# Patient Record
Sex: Male | Born: 1955
Health system: Southern US, Community
[De-identification: ages and names within clinical notes are randomized; demographics above are authoritative.]

## PROBLEM LIST (undated history)

## (undated) DIAGNOSIS — H269 Unspecified cataract: Secondary | ICD-10-CM

## (undated) DIAGNOSIS — I341 Nonrheumatic mitral (valve) prolapse: Secondary | ICD-10-CM

## (undated) DIAGNOSIS — H8110 Benign paroxysmal vertigo, unspecified ear: Secondary | ICD-10-CM

## (undated) DIAGNOSIS — F419 Anxiety disorder, unspecified: Secondary | ICD-10-CM

## (undated) DIAGNOSIS — Z8619 Personal history of other infectious and parasitic diseases: Secondary | ICD-10-CM

## (undated) DIAGNOSIS — F329 Major depressive disorder, single episode, unspecified: Secondary | ICD-10-CM

## (undated) DIAGNOSIS — F32A Depression, unspecified: Secondary | ICD-10-CM

## (undated) HISTORY — DX: Depression, unspecified: F32.A

## (undated) HISTORY — DX: Anxiety disorder, unspecified: F41.9

## (undated) HISTORY — DX: Unspecified cataract: H26.9

## (undated) HISTORY — PX: TONSILECTOMY, ADENOIDECTOMY, BILATERAL MYRINGOTOMY AND TUBES: SHX2538

## (undated) HISTORY — DX: Major depressive disorder, single episode, unspecified: F32.9

## (undated) HISTORY — DX: Nonrheumatic mitral (valve) prolapse: I34.1

## (undated) HISTORY — DX: Personal history of other infectious and parasitic diseases: Z86.19

## (undated) HISTORY — DX: Benign paroxysmal vertigo, unspecified ear: H81.10

---

## 2005-05-25 ENCOUNTER — Ambulatory Visit: Payer: Self-pay | Admitting: Internal Medicine

## 2013-01-19 ENCOUNTER — Ambulatory Visit: Payer: Self-pay | Admitting: Family Medicine

## 2013-02-03 ENCOUNTER — Ambulatory Visit (INDEPENDENT_AMBULATORY_CARE_PROVIDER_SITE_OTHER): Payer: No Typology Code available for payment source | Admitting: Family Medicine

## 2013-02-03 VITALS — BP 100/64 | HR 56 | Temp 98.0°F | Resp 12 | Ht 73.25 in | Wt 166.2 lb

## 2013-02-03 DIAGNOSIS — R002 Palpitations: Secondary | ICD-10-CM

## 2013-02-03 DIAGNOSIS — F411 Generalized anxiety disorder: Secondary | ICD-10-CM

## 2013-02-03 DIAGNOSIS — F429 Obsessive-compulsive disorder, unspecified: Secondary | ICD-10-CM

## 2013-02-03 LAB — POCT CBC
Lymph, poc: 1.3 (ref 0.6–3.4)
MCH, POC: 30.6 pg (ref 27–31.2)
MCHC: 32.3 g/dL (ref 31.8–35.4)
MCV: 94.7 fL (ref 80–97)
MID (cbc): 0.5 (ref 0–0.9)
POC LYMPH PERCENT: 17.6 %L (ref 10–50)
Platelet Count, POC: 268 10*3/uL (ref 142–424)
RBC: 4.93 M/uL (ref 4.69–6.13)
WBC: 7.4 10*3/uL (ref 4.6–10.2)

## 2013-02-03 LAB — COMPREHENSIVE METABOLIC PANEL
CO2: 31 mEq/L (ref 19–32)
Creat: 1.22 mg/dL (ref 0.50–1.35)
Glucose, Bld: 87 mg/dL (ref 70–99)
Total Bilirubin: 0.7 mg/dL (ref 0.3–1.2)

## 2013-02-03 LAB — POCT URINALYSIS DIPSTICK
Ketones, UA: NEGATIVE
Leukocytes, UA: NEGATIVE
Protein, UA: NEGATIVE
Spec Grav, UA: 1.02
pH, UA: 7.5

## 2013-02-03 LAB — POCT UA - MICROSCOPIC ONLY
Bacteria, U Microscopic: NEGATIVE
Casts, Ur, LPF, POC: NEGATIVE
Crystals, Ur, HPF, POC: NEGATIVE
Yeast, UA: NEGATIVE

## 2013-02-03 LAB — TSH: TSH: 2.731 u[IU]/mL (ref 0.350–4.500)

## 2013-02-03 MED ORDER — FLUOXETINE HCL 40 MG PO CAPS: 40.0000 mg | ORAL_CAPSULE | Freq: Every day | ORAL | Status: DC

## 2013-02-03 NOTE — Assessment & Plan Note (Signed)
Controlled; no change in medication; refill provided. 

## 2013-02-03 NOTE — Assessment & Plan Note (Signed)
New. Onset after using Creatine supplement but has persisted for the past week. Obtain labs including CMET, TSH, CBC, u/a.  Refer to cardiology.  EKG normal today other than short PR interval.

## 2013-02-03 NOTE — Assessment & Plan Note (Signed)
Controlled; no change in therapy; refill provided.

## 2013-02-03 NOTE — Progress Notes (Signed)
[address]   [place], [place]  [address]   [phone]  Subjective:    Patient ID: Joseph Saunders, male    DOB: December 27, 1955, 57 y.o.   MRN: [id number]  HPI This 57 y.o. male presents for evaluation of the following:  1.  Palpitations: in evenings and at bed, notices fluttering in chest.  Has been taking Creatinne after exercise.  Coffee and tea daily; no excess; one glass of beer or wine.Just restarted exercising in past few weeks; just restarted exercises.  Sporadic walking before starting running program.  Feels well with running except stamina still decreased.  Denies chest pain, SOB, leg swelling.  2.  Anxiety and OCD: onset during adolescents.  History of excessive worry and hypochondriasis.  Well controlled with Fluoxetine 40mg  daily.   Work and personal life going really well.  History of tics in teenage years; no persistence. Doing really well emotionally.  Wife feels that patient doing really well emotionally.  Review of Systems  Constitutional: Negative for fever, chills, diaphoresis, activity change, appetite change and fatigue.  Respiratory: Negative for shortness of breath, wheezing and stridor.   Cardiovascular: Positive for palpitations. Negative for chest pain and leg swelling.  Neurological: Negative for dizziness, seizures, syncope, facial asymmetry, weakness, light-headedness, numbness and headaches.  Psychiatric/Behavioral: Negative for suicidal ideas, sleep disturbance, self-injury, dysphoric mood and decreased concentration. The patient is nervous/anxious.     Past Medical History  Diagnosis Date  . Depression   . Mitral valve prolapse   . Cataract   . Anxiety     OCD; history of tics in adolescents.    Past Surgical History  Procedure Laterality Date  . Tonsilectomy, adenoidectomy, bilateral myringotomy and tubes      Prior to Admission medications   Medication Sig Start Date End Date Taking? Authorizing Provider  fish oil-omega-3 fatty acids 1000  MG capsule Take 2 g by mouth daily.   Yes Historical Provider, MD  FLUoxetine (PROZAC) 40 MG capsule Take 40 mg by mouth daily.   Yes Historical Provider, MD  Multiple Vitamins-Minerals (MULTIVITAMIN WITH MINERALS) tablet Take 1 tablet by mouth daily.   Yes Historical Provider, MD    Allergies  Allergen Reactions  . Sulfa Antibiotics Nausea And Vomiting    History   Social History  . Marital Status: Single    Spouse Name: N/A    Number of Children: 4  . Years of Education: N/A   Occupational History  .      insurance agent and business   Social History Main Topics  . Smoking status: Never Smoker   . Smokeless tobacco: Not on file  . Alcohol Use: 3.0 oz/week    2 Glasses of wine, 3 Cans of beer per week  . Drug Use: No  . Sexually Active: Yes     Comment: married   Other Topics Concern  . Not on file   Social History Narrative  . No narrative on file    Family History  Problem Relation Age of Onset  . Heart disease Father 61    CHF; smoking/tobacco  . Diverticulitis Brother   . Mental illness Brother   . Cancer Maternal Grandfather   . Heart disease Paternal Grandfather   . Osteoporosis Mother   . Mental illness Brother        Objective:   Physical Exam  Nursing note and vitals reviewed. Constitutional: He is oriented to person, place, and time. He appears well-developed and well-nourished. No distress.  Cardiovascular: Normal rate, regular rhythm, normal heart sounds and intact distal pulses.  Exam reveals no gallop and no friction rub.   No murmur heard. Pulmonary/Chest: Effort normal and breath sounds normal.  Abdominal: Soft. Bowel sounds are normal. He exhibits no distension and no mass. There is no tenderness. There is no rebound and no guarding.  Neurological: He is alert and oriented to person, place, and time. No cranial nerve deficit. He exhibits normal muscle tone. Coordination normal.  Skin: Skin is warm and dry. He is not diaphoretic.    Psychiatric: He has a normal mood and affect. His behavior is normal. Judgment and thought content normal.    EKG:  NSR; short PR interval.    Results for orders placed in visit on 02/03/13  POCT CBC      Result Value Range   WBC 7.4  4.6 - 10.2 K/uL   Lymph, poc 1.3  0.6 - 3.4   POC LYMPH PERCENT 17.6  10 - 50 %L   MID (cbc) 0.5  0 - 0.9   POC MID % 6.1  0 - 12 %M   POC Granulocyte 5.6  2 - 6.9   Granulocyte percent 76.3  37 - 80 %G   RBC 4.93  4.69 - 6.13 M/uL   Hemoglobin 15.1  14.1 - 18.1 g/dL   HCT, POC 16.1  09.6 - 53.7 %   MCV 94.7  80 - 97 fL   MCH, POC 30.6  27 - 31.2 pg   MCHC 32.3  31.8 - 35.4 g/dL   RDW, POC 04.5     Platelet Count, POC 268  142 - 424 K/uL   MPV 8.5  0 - 99.8 fL  POCT UA - MICROSCOPIC ONLY      Result Value Range   WBC, Ur, HPF, POC neg     RBC, urine, microscopic neg     Bacteria, U Microscopic neg     Mucus, UA neg     Epithelial cells, urine per micros neg     Crystals, Ur, HPF, POC neg     Casts, Ur, LPF, POC neg     Yeast, UA neg     Amorphous, UA positive    POCT URINALYSIS DIPSTICK      Result Value Range   Color, UA yellow     Clarity, UA clear     Glucose, UA neg     Bilirubin, UA neg     Ketones, UA neg     Spec Grav, UA 1.020     Blood, UA neg     pH, UA 7.5     Protein, UA neg     Urobilinogen, UA 0.2     Nitrite, UA neg     Leukocytes, UA Negative      Assessment & Plan:  Palpitations - Plan: EKG 12-Lead, POCT CBC, POCT UA - Microscopic Only, POCT urinalysis dipstick, Comprehensive metabolic panel, TSH  Generalized anxiety disorder  OCD (obsessive compulsive disorder)

## 2013-02-09 NOTE — Progress Notes (Signed)
CPE appt made for 06/05/13.

## 2013-05-24 ENCOUNTER — Ambulatory Visit (INDEPENDENT_AMBULATORY_CARE_PROVIDER_SITE_OTHER): Payer: No Typology Code available for payment source | Admitting: Family Medicine

## 2013-05-24 ENCOUNTER — Encounter: Payer: Self-pay | Admitting: Family Medicine

## 2013-05-24 VITALS — HR 64 | Temp 97.6°F | Resp 16 | Ht 73.0 in | Wt 169.4 lb

## 2013-05-24 DIAGNOSIS — Z1211 Encounter for screening for malignant neoplasm of colon: Secondary | ICD-10-CM

## 2013-05-24 DIAGNOSIS — Z23 Encounter for immunization: Secondary | ICD-10-CM

## 2013-05-24 DIAGNOSIS — Z0000 Encounter for general adult medical examination without abnormal findings: Secondary | ICD-10-CM

## 2013-05-24 DIAGNOSIS — Z125 Encounter for screening for malignant neoplasm of prostate: Secondary | ICD-10-CM

## 2013-05-24 LAB — LIPID PANEL
Cholesterol: 159 mg/dL (ref 0–200)
HDL: 46 mg/dL (ref >39)
Total CHOL/HDL Ratio: 3.5 Ratio

## 2013-05-24 LAB — COMPREHENSIVE METABOLIC PANEL
ALT: 19 U/L (ref 0–53)
AST: 22 U/L (ref 0–37)
Alkaline Phosphatase: 81 U/L (ref 39–117)
BUN: 13 mg/dL (ref 6–23)
CO2: 28 mEq/L (ref 19–32)
Creat: 1.05 mg/dL (ref 0.50–1.35)
Sodium: 137 mEq/L (ref 135–145)

## 2013-05-24 LAB — HEMOGLOBIN A1C
Hgb A1c MFr Bld: 5.2 % (ref <5.7)
Mean Plasma Glucose: 103 mg/dL (ref <117)

## 2013-05-24 LAB — PSA: PSA: 1.42 ng/mL (ref <=4.00)

## 2013-05-24 NOTE — Patient Instructions (Signed)
1.  Please start Aspirin 81mg  one tablet daily for heart attack and stroke prevention. 2.  We will contact you in the upcoming week with appointment with Dr. Matthias Hughs. 3.  Recommend evaluation by dermatologist within upcoming year.

## 2013-05-24 NOTE — Progress Notes (Signed)
[address]   [place], [place]  [address]   [phone]  Subjective:    Patient ID: Joseph Saunders, male    DOB: May 29, 1956, 57 y.o.   MRN: [id number] This chart was scribed for Ethelda Chick, MD by Valera Castle, ED Scribe. This patient was seen in room 21 and the patient's care was started at 10:17 AM.  HPI Joseph Saunders is a 57 y.o. male who presents to the [facility] for an annual exam. He states he cannot remember his last exam, but states he plans to f/u regularly for his exams from now on. He was seen here last 12/2011. He reports having his Prozac Rx filled and that he was having increased heart beat then as well, but that they have subsided.  Due to improvement with cessation of creatine supplement, patient cancelled appointment after last visit with cardiology.  He denies having a colonoscopy. He is compliant with setting up an appointment for one. He reports having his flu vaccination today. He reports that his last tetanus was in 2006. He reports seeing Dr. Elisabeth Most.ophthalmologist.   He reports h/o degenerative cataracts. He states he will see Dr. Alden Hipp due to insurance reasons. He reports it has been a while since his last dentist visit. He reports the only medication he has been taking is his Prozac. He reports taking multi-vitamins regularly, and Aspirin erratically, 1-2 times a week.   He reports being married for 30 years and having a healthy relationship with his wife. He reports they have 4 children, 3 biological and 1 adopted. He denies having any grandchildren. He reports his oldest son just got married. He reports being in the [employer] for his profession. He denies a h/o smoking, IV drug use. He reports occasional EtOH use. He states he was exercising 3 days a week with weights and walking, but reports just walking now, due to some mild elbow pain. He reports mild left thumb pain. He reports he wears his seatbelt regularly. He denies having any guns at  home. He likes to fish.   He reports his mother is doing well, and not taking any medication. He reports his father passed away in his sleep at 42 of CHF and black lung. He denies his father having a h/o MI. He reports having 2 brothers and 3 sisters. His youngest brother is a hypochondriac. His oldest brother has diverticulitis and recent colon surgery. He denies either brother having heart problems or cancer. He reports being allergic to Sulfides. He reports his sisters are healthy.   He reports a h/o BPV, but denies any recent episodes. He reports being worried about some skin spots on his head. He reports having a screening done on a spot on his left forehead, that came back negative. He reports a h/o walking pneumonia at 57 years old, and states that ever since then when he coughs he hears an unusual sound. He states that the sound has not worsened any. He reports going to a chiropractor for his neck pain, with success. He reports a h/o inguinal hernias. He states that he only has to urinate once an evening. He reports that his urine stream has decreased a little. He reports him and his wife usually go to bed late, and wake up early. He reports a h/o prostate exam in 2000 by Dr. Lovell Sheehan.   He denies hearing problems, constant ear ringing, neck pain, chest pain, back pain, SOB, extremity numbness and tingling, leg swelling,  abnormal BMs, diarrhea, constipation, melena, hematochezia, frequent heartburn, nausea, emesis, frequency, erection dysfunction, sexual problems, scrotal swelling and pain, penile swelling and pain, and any other associated symptoms.   Patient Active Problem List   Diagnosis Date Noted   OCD (obsessive compulsive disorder) 02/03/2013   Generalized anxiety disorder 02/03/2013   Palpitations 02/03/2013   Past Medical History  Diagnosis Date   Depression    Mitral valve prolapse    Cataract    Anxiety     OCD; history of tics in adolescents.   Benign positional  vertigo    Past Surgical History  Procedure Laterality Date   Tonsilectomy, adenoidectomy, bilateral myringotomy and tubes     Allergies  Allergen Reactions   Sulfa Antibiotics Nausea And Vomiting   Prior to Admission medications   Medication Sig Start Date End Date Taking? Authorizing Provider  fish oil-omega-3 fatty acids 1000 MG capsule Take 2 g by mouth daily.   Yes Historical Provider, MD  FLUoxetine (PROZAC) 40 MG capsule Take 1 capsule (40 mg total) by mouth daily. 02/03/13  Yes Ethelda Chick, MD  Multiple Vitamins-Minerals (MULTIVITAMIN WITH MINERALS) tablet Take 1 tablet by mouth daily.   Yes Historical Provider, MD    Review of Systems  Constitutional: Negative for activity change, fatigue and unexpected weight change.  HENT: Positive for dental problem. Negative for ear pain, hearing loss, mouth sores, tinnitus, trouble swallowing and voice change.   Eyes: Positive for photophobia and visual disturbance. Negative for pain.  Respiratory: Negative for cough, chest tightness and shortness of breath.   Cardiovascular: Negative for chest pain and leg swelling.  Gastrointestinal: Negative for nausea, vomiting, abdominal pain, diarrhea, constipation, blood in stool and rectal pain.  Endocrine: Negative for heat intolerance, polydipsia, polyphagia and polyuria.  Genitourinary: Positive for decreased urine volume. Negative for dysuria, urgency, frequency, hematuria, flank pain, penile swelling, scrotal swelling, enuresis, difficulty urinating, genital sores, penile pain and testicular pain.  Musculoskeletal: Positive for arthralgias (elbow, left thumb). Negative for back pain, joint swelling, neck pain and neck stiffness.  Skin: Positive for color change. Negative for rash.  Neurological: Negative for dizziness, tremors, syncope, facial asymmetry, speech difficulty, weakness, light-headedness, numbness and headaches.  Psychiatric/Behavioral: Negative for sleep disturbance, dysphoric  mood and decreased concentration. The patient is not nervous/anxious.   All other systems reviewed and are negative.      Objective:   Physical Exam  Nursing note and vitals reviewed. Constitutional: He is oriented to person, place, and time. He appears well-developed and well-nourished. No distress.  HENT:  Head: Normocephalic and atraumatic.  Right Ear: External ear normal.  Left Ear: External ear normal.  Nose: Nose normal.  Mouth/Throat: Oropharynx is clear and moist.  Eyes: Conjunctivae and EOM are normal. Pupils are equal, round, and reactive to light.  Neck: Normal range of motion. Neck supple. No JVD present. No tracheal deviation present. No thyromegaly present.  Cardiovascular: Normal rate, regular rhythm, normal heart sounds and intact distal pulses.  Exam reveals no gallop and no friction rub.   No murmur heard. Pulmonary/Chest: Effort normal and breath sounds normal. No respiratory distress. He has no wheezes. He has no rales.  Abdominal: Soft. Bowel sounds are normal. He exhibits no distension and no mass. There is no tenderness. There is no rebound and no guarding. Hernia confirmed negative in the right inguinal area and confirmed negative in the left inguinal area.  Genitourinary: Rectum normal, testes normal and penis normal. Prostate is enlarged. No penile tenderness.  Musculoskeletal: Normal range of motion.  Lymphadenopathy:    He has no cervical adenopathy.  Neurological: He is alert and oriented to person, place, and time. He has normal reflexes. No cranial nerve deficit. He exhibits normal muscle tone. Coordination normal.  Skin: Skin is warm and dry.  Psychiatric: He has a normal mood and affect. His behavior is normal. Judgment and thought content normal.    Triage Vitals: Pulse 64   Temp(Src) 97.6 F (36.4 C) (Oral)   Resp 16   Ht 6\' 1"  (1.854 m)   Wt 169 lb 6.4 oz (76.839 kg)   BMI 22.35 kg/m2   SpO2 98%  INFLUENZA VACCINE AND TDAP ADMINISTERED.        Assessment & Plan:  Routine general medical examination at a health care facility - Plan: Hemoglobin A1c, Lipid panel, PSA, Comprehensive metabolic panel, Vitamin B12, Vit D  25 hydroxy (rtn osteoporosis monitoring)  Need for prophylactic vaccination and inoculation against influenza - Plan: Flu Vaccine QUAD 36+ mos IM  Colon cancer screening - Plan: Ambulatory referral to Gastroenterology  Need for prophylactic vaccination with combined diphtheria-tetanus-pertussis (DTP) vaccine - Plan: Tdap vaccine greater than or equal to 7yo IM  Prostate cancer screening - Plan: PSA  1. CPE:  Anticipatory guidance provided.  Recommend starting ASA 81mg  daily; s/p TDAP and influenza vaccines in office. Refer for colonoscopy.  Obtain labs. 2.  S/p TDAP 3. S/p influenza vaccine. 4.  Colon cancer screening: refer to GI for colonoscopy. 5.  Prostate cancer screening: s/p DRE; obtain PSA. 6.  OCD/anxiety/tics: controlled with current dose of Prozac.     I personally performed the services described in this documentation, which was scribed in my presence.  The recorded information has been reviewed and is accurate.

## 2013-05-24 NOTE — Progress Notes (Signed)
  Subjective:    Patient ID: Joseph Saunders, male    DOB: 1956-01-14, 57 y.o.   MRN: [id number]  HPI    Review of Systems  Constitutional: Negative.   HENT: Positive for dental problem.   Respiratory: Negative.   Cardiovascular: Negative.   Gastrointestinal: Negative.   Endocrine: Negative.   Genitourinary: Negative.   Musculoskeletal: Negative.   Skin: Negative.   Allergic/Immunologic: Negative.   Neurological: Negative.   Hematological: Negative.   Psychiatric/Behavioral: Negative.        Objective:   Physical Exam        Assessment & Plan:

## 2013-05-31 ENCOUNTER — Encounter: Payer: Self-pay | Admitting: *Deleted

## 2013-06-05 ENCOUNTER — Encounter: Payer: No Typology Code available for payment source | Admitting: Family Medicine

## 2013-11-11 ENCOUNTER — Ambulatory Visit (INDEPENDENT_AMBULATORY_CARE_PROVIDER_SITE_OTHER): Payer: No Typology Code available for payment source | Admitting: Family Medicine

## 2013-11-11 VITALS — BP 112/74 | HR 76 | Temp 98.1°F | Resp 16 | Ht 72.0 in | Wt 171.0 lb

## 2013-11-11 DIAGNOSIS — R52 Pain, unspecified: Secondary | ICD-10-CM

## 2013-11-11 DIAGNOSIS — R509 Fever, unspecified: Secondary | ICD-10-CM

## 2013-11-11 LAB — POCT CBC
GRANULOCYTE PERCENT: 80.6 % — AB (ref 37–80)
HEMATOCRIT: 49.7 % (ref 43.5–53.7)
Hemoglobin: 16.2 g/dL (ref 14.1–18.1)
Lymph, poc: 1.4 (ref 0.6–3.4)
MCH, POC: 29.9 pg (ref 27–31.2)
MCHC: 32.6 g/dL (ref 31.8–35.4)
MCV: 91.9 fL (ref 80–97)
MID (cbc): 0.7 (ref 0–0.9)
MPV: 10.1 fL (ref 0–99.8)
POC GRANULOCYTE: 8.5 — AB (ref 2–6.9)
POC LYMPH PERCENT: 13.2 %L (ref 10–50)
POC MID %: 6.2 %M (ref 0–12)
Platelet Count, POC: 338 10*3/uL (ref 142–424)
RBC: 5.41 M/uL (ref 4.69–6.13)
RDW, POC: 13.2 %
WBC: 10.6 10*3/uL — AB (ref 4.6–10.2)

## 2013-11-11 LAB — POCT URINALYSIS DIPSTICK
Bilirubin, UA: NEGATIVE
Blood, UA: NEGATIVE
Glucose, UA: NEGATIVE
KETONES UA: NEGATIVE
LEUKOCYTES UA: NEGATIVE
Nitrite, UA: NEGATIVE
PH UA: 6.5
PROTEIN UA: NEGATIVE
Spec Grav, UA: 1.015
UROBILINOGEN UA: 0.2

## 2013-11-11 LAB — POCT UA - MICROSCOPIC ONLY
BACTERIA, U MICROSCOPIC: NEGATIVE
CRYSTALS, UR, HPF, POC: NEGATIVE
Casts, Ur, LPF, POC: NEGATIVE
Mucus, UA: NEGATIVE
Yeast, UA: NEGATIVE

## 2013-11-11 NOTE — Progress Notes (Signed)
This chart was scribed for Wardell Honour, MD by Einar Pheasant, ED Scribe. This patient was seen in room 3 and the patient's care was started at 4:02 PM. Subjective:    Patient ID: Joseph Saunders, male    DOB: Jun 30, 1956, 58 y.o.   MRN: [id number]  11/11/2013  Fever, Chest Pain and Headache   HPI HPI Comments: Joseph Saunders is a 58 y.o. male who presents to the [facility] complaining of intermittent fever that started 5 days ago, with a measured TMAX of 102. Current office temperature is 98.1. Pt states that after a day of jogging he began to feel sick. He's experiencing sharp headaches, that are propelled by talking or loud noise. Pt is also complaining of associated generalized myalgias and chest pain. Pt states that the pain in his chest is worsened by walking. He states that every time he tries to take a step he feels pain and had to slow down his pace. Pt confirms one sick contact (wife). Denies any nausea, emesis, appetite change, hematuria, or dysuria. Pt denies any tick marks or rash. Has 3 dogs.  Pt states that he has noticed that he is having to get up more frequently to urinate at night. He reports about 2 episodes. Pt states that he tried to wait it out to see if his symptoms would subside. He does report feeling 25 percent better.   Review of Systems  Constitutional: Positive for fever and chills. Negative for diaphoresis.  HENT: Positive for congestion. Negative for ear pain, postnasal drip, rhinorrhea and sore throat.   Eyes: Negative for photophobia and visual disturbance.  Respiratory: Negative for cough and shortness of breath.   Cardiovascular: Positive for chest pain. Negative for palpitations.  Gastrointestinal: Negative for nausea, vomiting, abdominal pain and diarrhea.  Genitourinary: Positive for frequency. Negative for dysuria, urgency and hematuria.  Musculoskeletal: Positive for myalgias. Negative for arthralgias, joint swelling, neck  pain and neck stiffness.  Skin: Negative for rash.  Neurological: Positive for headaches. Negative for dizziness and light-headedness.    Past Medical History  Diagnosis Date  . Depression   . Mitral valve prolapse   . Cataract   . Anxiety     OCD; history of tics in adolescents.  . Benign positional vertigo    Allergies  Allergen Reactions  . Sulfa Antibiotics Nausea And Vomiting   Current Outpatient Prescriptions  Medication Sig Dispense Refill  . fish oil-omega-3 fatty acids 1000 MG capsule Take 2 g by mouth daily.      . Multiple Vitamins-Minerals (MULTIVITAMIN WITH MINERALS) tablet Take 1 tablet by mouth daily.      Marland Kitchen FLUoxetine (PROZAC) 40 MG capsule Take 1 capsule (40 mg total) by mouth daily.  90 capsule  3   No current facility-administered medications for this visit.   History   Social History  . Marital Status: Single    Spouse Name: N/A    Number of Children: 4  . Years of Education: N/A   Occupational History  . 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 Smoking status: Never Smoker   . Smokeless tobacco: Not on file  . Alcohol Use: 3.0 oz/week    2 Glasses of wine, 3 Cans of beer per week     Comment: 5 drinks  . Drug Use: No  . Sexual Activity: Yes    Partners: Female     Comment: married  Other Topics Concern  . Not on file   Social History Narrative   Marital status:  Married x 29 years      Children: 4 (3 biological, 1 adopted).      Lives: with wife, children.      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      Tobacco: none      Alcohol: one drink per night with dinner.      Drugs:  None      Exercise:  Walking.      Seatbelts: 100%      Guns: none      Education: College/Other.       Objective:    BP 112/74  Pulse 76  Temp(Src) 98.1 F (36.7 C)  Resp 16  Ht 6' (1.829 m)  Wt 171 lb (77.565 kg)  BMI 23.19 kg/m2  SpO2 99%  Physical Exam  Nursing note and vitals  reviewed. Constitutional: He is oriented to person, place, and time. He appears well-developed and well-nourished. No distress.  HENT:  Head: Normocephalic and atraumatic.  Right Ear: External ear normal.  Left Ear: External ear normal.  Nose: Nose normal.  Mouth/Throat: Oropharynx is clear and moist. No oropharyngeal exudate.  Eyes: Conjunctivae and EOM are normal. Pupils are equal, round, and reactive to light. Right eye exhibits no discharge. Left eye exhibits no discharge.  Neck: Normal range of motion. Neck supple. No thyromegaly present.  Cardiovascular: Normal rate, regular rhythm and normal heart sounds.  Exam reveals no gallop and no friction rub.   No murmur heard. Pulmonary/Chest: Effort normal and breath sounds normal. No respiratory distress. He has no wheezes. He has no rales. He exhibits no tenderness.  Abdominal: Soft. Bowel sounds are normal. He exhibits no distension and no mass. There is no tenderness. There is no rebound and no guarding.  Musculoskeletal: Normal range of motion. He exhibits no edema and no tenderness.  Lymphadenopathy:    He has no cervical adenopathy.  Neurological: He is alert and oriented to person, place, and time. He has normal reflexes. No cranial nerve deficit. Coordination normal.  Skin: Skin is warm and dry. No rash noted.  Psychiatric: He has a normal mood and affect. His behavior is normal. Thought content normal.    Results for orders placed in visit on 11/11/13  POCT CBC      Result Value Ref Range   WBC 10.6 (*) 4.6 - 10.2 K/uL   Lymph, poc 1.4  0.6 - 3.4   POC LYMPH PERCENT 13.2  10 - 50 %L   MID (cbc) 0.7  0 - 0.9   POC MID % 6.2  0 - 12 %M   POC Granulocyte 8.5 (*) 2 - 6.9   Granulocyte percent 80.6 (*) 37 - 80 %G   RBC 5.41  4.69 - 6.13 M/uL   Hemoglobin 16.2  14.1 - 18.1 g/dL   HCT, POC 49.7  43.5 - 53.7 %   MCV 91.9  80 - 97 fL   MCH, POC 29.9  27 - 31.2 pg   MCHC 32.6  31.8 - 35.4 g/dL   RDW, POC 13.2     Platelet Count,  POC 338  142 - 424 K/uL   MPV 10.1  0 - 99.8 fL  POCT UA - MICROSCOPIC ONLY      Result Value Ref Range   WBC, Ur, HPF, POC 0-1     RBC, urine, microscopic 0-1     Bacteria, U  Microscopic neg     Mucus, UA neg     Epithelial cells, urine per micros 0-1     Crystals, Ur, HPF, POC neg     Casts, Ur, LPF, POC neg     Yeast, UA neg    POCT URINALYSIS DIPSTICK      Result Value Ref Range   Color, UA yellow     Clarity, UA clear     Glucose, UA neg     Bilirubin, UA neg     Ketones, UA neg     Spec Grav, UA 1.015     Blood, UA neg     pH, UA 6.5     Protein, UA neg     Urobilinogen, UA 0.2     Nitrite, UA neg     Leukocytes, UA Negative        Assessment & Plan:   1. Fever, unspecified   2. Body aches    1. Fever:  New.  Associated with body aches, mild cough.  Improving by 25% in past 24 hours.  Recommend supportive care with rest, fluids, Ibuprofen and/or Tylenol.  RTC for persistent fever. Obtain RMSF titers, CK.  Patient does not desire to start Doxycycline for tick borne illness since clinically improving. 2.  Body aches:  New. Associated with fever after over-exertion.  Obtain labs including CK.  No orders of the defined types were placed in this encounter.    No Follow-up on file.   I personally performed the services described in this documentation, which was scribed in my presence. The recorded information has been reviewed and is accurate.  Reginia Forts, M.D.  [facility] [address] [place], [place]  [address] [phone] phone [phone] fax

## 2013-11-11 NOTE — Patient Instructions (Signed)
1. Rest for the next 3-5 days; avoid exercise during that time period. 2.  Return to office if you continue to feel poorly. 3. Call Dr. Tamala Julian if you have a fever > 100.5.

## 2013-11-12 LAB — CK: Total CK: 71 U/L (ref 7–232)

## 2013-11-13 LAB — URINE CULTURE
Colony Count: NO GROWTH
Organism ID, Bacteria: NO GROWTH

## 2013-11-13 LAB — ROCKY MTN SPOTTED FVR AB, IGM-BLOOD: ROCKY MTN SPOTTED FEVER, IGM: 0.28 IV

## 2013-11-23 ENCOUNTER — Telehealth: Payer: Self-pay | Admitting: *Deleted

## 2013-11-23 NOTE — Telephone Encounter (Signed)
Pt called back concerning unable to reach letter we sent him. Made him aware of lab results. Reports that he forgot to mention at the visit that he had been having some dental pain and upon seeing a dentist found out he had a severly abcessed tooth; which they believe to be the cause of the fever he was having. Reports that he is going to be seen by a oral surgeon. Other wise states that he is feeling fine.

## 2013-11-30 ENCOUNTER — Encounter: Payer: Self-pay | Admitting: Family Medicine

## 2014-01-22 ENCOUNTER — Other Ambulatory Visit: Payer: Self-pay

## 2014-01-22 DIAGNOSIS — F429 Obsessive-compulsive disorder, unspecified: Secondary | ICD-10-CM

## 2014-01-22 DIAGNOSIS — F411 Generalized anxiety disorder: Secondary | ICD-10-CM

## 2014-01-22 MED ORDER — FLUOXETINE HCL 40 MG PO CAPS: 40.0000 mg | ORAL_CAPSULE | Freq: Every day | ORAL | Status: DC

## 2014-03-09 ENCOUNTER — Telehealth: Payer: Self-pay

## 2014-03-09 NOTE — Telephone Encounter (Signed)
Pt needs a refill of prozac please!

## 2014-04-05 ENCOUNTER — Telehealth: Payer: Self-pay

## 2014-04-05 DIAGNOSIS — F411 Generalized anxiety disorder: Secondary | ICD-10-CM

## 2014-04-05 DIAGNOSIS — F429 Obsessive-compulsive disorder, unspecified: Secondary | ICD-10-CM

## 2014-04-05 NOTE — Telephone Encounter (Signed)
Patient's wife came in to 102 inquiring about fluoxetine rx. States they received a call stating that pt needs office visits before med can be refilled. Levander Campion states she thinks we may have been looking at her son's chart instead. Please return call and advise as pt is requesting the fluoxetine refilled. CB # [phone]

## 2014-04-06 MED ORDER — FLUOXETINE HCL 40 MG PO CAPS: 40.0000 mg | ORAL_CAPSULE | Freq: Every day | ORAL | Status: DC

## 2014-04-07 ENCOUNTER — Other Ambulatory Visit: Payer: Self-pay | Admitting: Family Medicine

## 2014-04-07 DIAGNOSIS — F411 Generalized anxiety disorder: Secondary | ICD-10-CM

## 2014-04-07 DIAGNOSIS — F429 Obsessive-compulsive disorder, unspecified: Secondary | ICD-10-CM

## 2014-04-07 MED ORDER — FLUOXETINE HCL 40 MG PO CAPS: 40.0000 mg | ORAL_CAPSULE | Freq: Every day | ORAL | Status: DC

## 2014-04-11 ENCOUNTER — Ambulatory Visit: Payer: No Typology Code available for payment source | Attending: Orthopedic Surgery | Admitting: Occupational Therapy

## 2014-04-11 DIAGNOSIS — M25549 Pain in joints of unspecified hand: Secondary | ICD-10-CM | POA: Diagnosis not present

## 2014-04-11 DIAGNOSIS — M25649 Stiffness of unspecified hand, not elsewhere classified: Secondary | ICD-10-CM | POA: Diagnosis not present

## 2014-04-11 DIAGNOSIS — IMO0001 Reserved for inherently not codable concepts without codable children: Secondary | ICD-10-CM | POA: Insufficient documentation

## 2014-04-19 ENCOUNTER — Ambulatory Visit: Payer: No Typology Code available for payment source | Admitting: Occupational Therapy

## 2014-04-25 ENCOUNTER — Encounter: Payer: No Typology Code available for payment source | Admitting: Occupational Therapy

## 2014-04-25 ENCOUNTER — Ambulatory Visit: Payer: No Typology Code available for payment source | Attending: Orthopedic Surgery | Admitting: Occupational Therapy

## 2014-04-25 DIAGNOSIS — S62606D Fracture of unspecified phalanx of right little finger, subsequent encounter for fracture with routine healing: Secondary | ICD-10-CM | POA: Diagnosis present

## 2014-04-25 DIAGNOSIS — X58XXXD Exposure to other specified factors, subsequent encounter: Secondary | ICD-10-CM | POA: Diagnosis not present

## 2014-04-25 DIAGNOSIS — M25641 Stiffness of right hand, not elsewhere classified: Secondary | ICD-10-CM | POA: Diagnosis not present

## 2014-04-25 DIAGNOSIS — M79644 Pain in right finger(s): Secondary | ICD-10-CM | POA: Diagnosis present

## 2014-05-23 ENCOUNTER — Ambulatory Visit: Payer: No Typology Code available for payment source | Attending: Orthopedic Surgery | Admitting: Occupational Therapy

## 2014-10-22 ENCOUNTER — Encounter: Payer: Self-pay | Admitting: Family Medicine

## 2014-10-22 ENCOUNTER — Ambulatory Visit (INDEPENDENT_AMBULATORY_CARE_PROVIDER_SITE_OTHER): Payer: 59 | Admitting: Family Medicine

## 2014-10-22 VITALS — BP 100/60 | HR 74 | Temp 97.2°F | Ht 73.0 in | Wt 171.4 lb

## 2014-10-22 DIAGNOSIS — Z7689 Persons encountering health services in other specified circumstances: Secondary | ICD-10-CM

## 2014-10-22 DIAGNOSIS — F418 Other specified anxiety disorders: Secondary | ICD-10-CM

## 2014-10-22 DIAGNOSIS — M25519 Pain in unspecified shoulder: Secondary | ICD-10-CM | POA: Insufficient documentation

## 2014-10-22 DIAGNOSIS — Z7189 Other specified counseling: Secondary | ICD-10-CM | POA: Diagnosis not present

## 2014-10-22 DIAGNOSIS — M25511 Pain in right shoulder: Secondary | ICD-10-CM

## 2014-10-22 DIAGNOSIS — F329 Major depressive disorder, single episode, unspecified: Secondary | ICD-10-CM

## 2014-10-22 DIAGNOSIS — F32A Depression, unspecified: Secondary | ICD-10-CM

## 2014-10-22 DIAGNOSIS — F419 Anxiety disorder, unspecified: Secondary | ICD-10-CM

## 2014-10-22 NOTE — Progress Notes (Signed)
Joseph Saunders is here to establish care. His prior PCP is no longer in network. They are from [facility] and like osteopathic medicine.   Has the following chronic problems that require follow up and concerns today:  R Shoulder Pain: -started doing heavy all of a sudden 1 year ago and this caused pain in the anterior shoulder -not lifting anymore but pain has continued with certain activities with abduction of R shoulder - dull pain -denies: weakness, numbness, radiation  Anxiety and depression: -started greater the 5 years ago -hx of mild depressed mood and GAD, now more rumination of thoughts -used to take prozac but prefers to avoid medications -takes St. John's wort and prefers to avoid medications -denies thoughts of self harm or hx SI or hospitalization   ROS negative for unless reported above: fevers, unintentional weight loss, hearing or vision loss, chest pain, palpitations, struggling to breath, hemoptysis, melena, hematochezia, hematuria, falls, loc, si, thoughts of self harm  Past Medical History  Diagnosis Date  . Mitral valve prolapse   . Cataract   . Anxiety and depression     OCD; history of tics in adolescents. On fluoxetine.  . Benign positional vertigo   . History of chicken pox     Past Surgical History  Procedure Laterality Date  . Tonsilectomy, adenoidectomy, bilateral myringotomy and tubes      Family History  Problem Relation Age of Onset  . Heart disease Father 23    CHF; smoking/tobacco  . Diverticulitis Brother   . Mental illness Brother   . Heart disease Paternal Grandfather   . Abnormal EKG Paternal Grandfather   . Osteoporosis Mother   . Mental illness Brother   . Cancer Maternal Grandmother   . Hypertension Paternal Grandmother   . Lung cancer Maternal Grandfather     History   Social History  . Marital Status: Married    Spouse Name: N/A  . Number of Children: 4  . Years of Education: N/A   Occupational History  . Printmaker and business   Social History Main Topics  . Smoking status: Never Smoker   . Smokeless tobacco: Not on file  . Alcohol Use: 1.2 oz/week    1 Glasses of wine, 1 Cans of beer per week     Comment: 1 drink daily in the past  . Drug Use: No  . Sexual Activity:    Partners: Female     Comment: married   Other Topics Concern  . None   Social History Narrative   Marital status:  Married x 29 years      Children: 4 (3 biological, 1 adopted).      Lives: with wife, children.      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      Tobacco: none      Alcohol: one drink per night with dinner.      Drugs:  None      Exercise:  Walking.     Diet: healthy      Seatbelts: 100%      Guns: none      Education: College/Other.   Religion: Glide, good support      Current outpatient prescriptions:  .  fish oil-omega-3 fatty acids 1000 MG capsule, Take 2 g by mouth daily., Disp: , Rfl:  .  Multiple Vitamins-Minerals (MULTIVITAMIN WITH MINERALS) tablet, Take 1 tablet by mouth daily., Disp: , Rfl:  .  S-Adenosylmethionine (SAM-E PO), Take  by mouth., Disp: , Rfl:   EXAM:  Filed Vitals:   10/22/14 1434  BP: 100/60  Pulse: 74  Temp: 97.2 F (36.2 C)    Body mass index is 22.62 kg/(m^2).  GENERAL: vitals reviewed and listed above, alert, oriented, appears well hydrated and in no acute distress  HEENT: atraumatic, conjunttiva clear, no obvious abnormalities on inspection of external nose and ears  NECK: no obvious masses on inspection  LUNGS: clear to auscultation bilaterally, no wheezes, rales or rhonchi, good air movement  CV: HRRR, no peripheral edema  MS: moves all extremities without noticeable abnormality -normal ROM, strength and sensation to light touch in UEs bilaterally -no TTP on exam except for mild at [facility] joint? -mildly + impingement test, but there wise normal exam other then bilat seemingly short collarbones with sharp angulation,  neg shaw and neers test  PSYCH: pleasant and cooperative, no obvious depression or anxiety  ASSESSMENT AND PLAN:  Discussed the following assessment and plan:  NOTE: > 30 minutes spent face to face counseleling this pt  Pain in joint, shoulder region, right -suspect possible RTC tendon pathology, though quer [place] joint issues as well and area he points to of pain in the past is the biceps tendon area - no TTP or findings on exam here today -HEP, sports med eval if persists  Anxiety and depression -CBT advise -discussed risks with supplements and meds -he will consider medication and CBT -follow up in 3 month or sooner if needed  Encounter to establish care  -We reviewed the PMH, PSH, FH, SH, Meds and Allergies. -We provided refills for any medications we will prescribe as needed. -We addressed current concerns per orders and patient instructions. -We have asked for records for pertinent exams, studies, vaccines and notes from previous providers. -We have advised patient to follow up per instructions below.   -Patient advised to return or notify a doctor immediately if symptoms worsen or persist or new concerns arise.  Patient Instructions  BEFORE YOU LEAVE: -schedule physical and follow up in about 3 months - morning appointment, come fasting but drink plenty of water  Do the exercise 3-4 days per week  Consider CBT (counseling) with Dr. Glennon Saunders  We recommend the following healthy lifestyle measures: - eat a healthy diet consisting of lots of vegetables, fruits, beans, nuts, seeds, healthy meats such as white chicken and fish and whole grains.  - avoid fried foods, fast food, processed foods, sodas, red meet and other fattening foods.  - get a least 150 minutes of aerobic exercise per week.       Joseph Saunders R.

## 2014-10-22 NOTE — Progress Notes (Signed)
Pre visit review using our clinic review tool, if applicable. No additional management support is needed unless otherwise documented below in the visit note. 

## 2014-10-22 NOTE — Patient Instructions (Addendum)
BEFORE YOU LEAVE: -schedule physical and follow up in about 3 months - morning appointment, come fasting but drink plenty of water  Do the exercise 3-4 days per week  Consider CBT (counseling) with Dr. Glennon Hamilton  We recommend the following healthy lifestyle measures: - eat a healthy diet consisting of lots of vegetables, fruits, beans, nuts, seeds, healthy meats such as white chicken and fish and whole grains.  - avoid fried foods, fast food, processed foods, sodas, red meet and other fattening foods.  - get a least 150 minutes of aerobic exercise per week.

## 2015-07-26 ENCOUNTER — Ambulatory Visit: Payer: No Typology Code available for payment source | Admitting: Family Medicine

## 2015-07-29 ENCOUNTER — Encounter: Payer: Self-pay | Admitting: Family Medicine

## 2015-07-29 ENCOUNTER — Ambulatory Visit (INDEPENDENT_AMBULATORY_CARE_PROVIDER_SITE_OTHER): Payer: BLUE CROSS/BLUE SHIELD | Admitting: Family Medicine

## 2015-07-29 VITALS — BP 118/70 | HR 71 | Temp 97.9°F | Resp 16 | Ht 73.25 in | Wt 173.6 lb

## 2015-07-29 DIAGNOSIS — Z13220 Encounter for screening for lipoid disorders: Secondary | ICD-10-CM

## 2015-07-29 DIAGNOSIS — Z23 Encounter for immunization: Secondary | ICD-10-CM

## 2015-07-29 DIAGNOSIS — F32A Depression, unspecified: Secondary | ICD-10-CM

## 2015-07-29 DIAGNOSIS — Z114 Encounter for screening for human immunodeficiency virus [HIV]: Secondary | ICD-10-CM | POA: Diagnosis not present

## 2015-07-29 DIAGNOSIS — R1031 Right lower quadrant pain: Secondary | ICD-10-CM | POA: Diagnosis not present

## 2015-07-29 DIAGNOSIS — Z1159 Encounter for screening for other viral diseases: Secondary | ICD-10-CM | POA: Diagnosis not present

## 2015-07-29 DIAGNOSIS — Z0000 Encounter for general adult medical examination without abnormal findings: Secondary | ICD-10-CM | POA: Diagnosis not present

## 2015-07-29 DIAGNOSIS — F419 Anxiety disorder, unspecified: Secondary | ICD-10-CM

## 2015-07-29 DIAGNOSIS — Z131 Encounter for screening for diabetes mellitus: Secondary | ICD-10-CM

## 2015-07-29 DIAGNOSIS — F418 Other specified anxiety disorders: Secondary | ICD-10-CM

## 2015-07-29 DIAGNOSIS — R351 Nocturia: Secondary | ICD-10-CM

## 2015-07-29 DIAGNOSIS — F329 Major depressive disorder, single episode, unspecified: Secondary | ICD-10-CM

## 2015-07-29 LAB — COMPREHENSIVE METABOLIC PANEL
ALBUMIN: 4.5 g/dL (ref 3.6–5.1)
ALK PHOS: 93 U/L (ref 40–115)
ALT: 20 U/L (ref 9–46)
AST: 18 U/L (ref 10–35)
BILIRUBIN TOTAL: 0.5 mg/dL (ref 0.2–1.2)
BUN: 19 mg/dL (ref 7–25)
CO2: 32 mmol/L — ABNORMAL HIGH (ref 20–31)
CREATININE: 1.44 mg/dL — AB (ref 0.70–1.25)
Calcium: 9.3 mg/dL (ref 8.6–10.3)
Chloride: 101 mmol/L (ref 98–110)
Glucose, Bld: 94 mg/dL (ref 65–99)
Potassium: 4.1 mmol/L (ref 3.5–5.3)
SODIUM: 141 mmol/L (ref 135–146)
TOTAL PROTEIN: 6.7 g/dL (ref 6.1–8.1)

## 2015-07-29 LAB — POCT URINALYSIS DIP (MANUAL ENTRY)
BILIRUBIN UA: NEGATIVE
Bilirubin, UA: NEGATIVE
GLUCOSE UA: NEGATIVE
Leukocytes, UA: NEGATIVE
Nitrite, UA: NEGATIVE
Protein Ur, POC: NEGATIVE
RBC UA: NEGATIVE
SPEC GRAV UA: 1.02
UROBILINOGEN UA: 0.2
pH, UA: 6.5

## 2015-07-29 LAB — CBC WITH DIFFERENTIAL/PLATELET
BASOS ABS: 0.1 10*3/uL (ref 0.0–0.1)
BASOS PCT: 1 % (ref 0–1)
Eosinophils Absolute: 0.2 10*3/uL (ref 0.0–0.7)
Eosinophils Relative: 2 % (ref 0–5)
HCT: 46.8 % (ref 39.0–52.0)
HEMOGLOBIN: 16 g/dL (ref 13.0–17.0)
Lymphocytes Relative: 15 % (ref 12–46)
Lymphs Abs: 1.3 10*3/uL (ref 0.7–4.0)
MCH: 29.9 pg (ref 26.0–34.0)
MCHC: 34.2 g/dL (ref 30.0–36.0)
MCV: 87.3 fL (ref 78.0–100.0)
MONOS PCT: 8 % (ref 3–12)
MPV: 9.9 fL (ref 8.6–12.4)
Monocytes Absolute: 0.7 10*3/uL (ref 0.1–1.0)
NEUTROS ABS: 6.2 10*3/uL (ref 1.7–7.7)
NEUTROS PCT: 74 % (ref 43–77)
PLATELETS: 321 10*3/uL (ref 150–400)
RBC: 5.36 MIL/uL (ref 4.22–5.81)
RDW: 12.9 % (ref 11.5–15.5)
WBC: 8.4 10*3/uL (ref 4.0–10.5)

## 2015-07-29 LAB — POC MICROSCOPIC URINALYSIS (UMFC): Mucus: ABSENT

## 2015-07-29 LAB — LIPID PANEL
Cholesterol: 167 mg/dL (ref 125–200)
HDL: 46 mg/dL (ref >=40)
LDL CALC: 97 mg/dL (ref <130)
Total CHOL/HDL Ratio: 3.6 Ratio (ref <=5.0)
Triglycerides: 122 mg/dL (ref <150)
VLDL: 24 mg/dL (ref <30)

## 2015-07-29 LAB — HEMOGLOBIN A1C
HEMOGLOBIN A1C: 5.1 % (ref <5.7)
MEAN PLASMA GLUCOSE: 100 mg/dL (ref <117)

## 2015-07-29 LAB — TSH: TSH: 2.178 u[IU]/mL (ref 0.350–4.500)

## 2015-07-29 MED ORDER — ZOSTER VACCINE LIVE 19400 UNT/0.65ML ~~LOC~~ SOLR: 0.6500 mL | Freq: Once | SUBCUTANEOUS | Status: AC

## 2015-07-29 NOTE — Progress Notes (Signed)
Subjective:    Patient ID: Joseph Saunders, male    DOB: 06/01/1956, 60 y.o.   MRN: [id number]  07/29/2015  Flank Pain   HPI This 60 y.o. male presents for follow-up and Complete Physical Examination:   1. L abdominal discomfort:  History of hernia dx; has never undergone revision. Has been working out 3 days per week in garage; with lifting weights from dead weight with burning B lower abdomen.  Intermittent.  No n/v.  Some diarrhea; intermittent; has not kept up with diarrhea.  1-2 days per week on average with diarrhea.  Not sure of color of stool.  No constipation.  S/p colonoscopy with Buccini in 07/2013; recurrent 5 year schedule due to polyps.  No dysuria, hematuria, frequency.  Nocturia x 2-3 times per night with excessive fluid intake.  Urine stream is weak; years.  No drastic change.  Appetite is poor; forgets to eat; will go all day and not eat; chronic issue for patient.   No weight loss; weight has improved with weight lifting.  Onset for months. Pain radiates to L lower back some; no pain with hip range of motion.  No pain with bending, going up or down steps.   2.  Anxiety and depression/OCD: taking [place]; feels very steady; very motivated for work; relationship with wife is better than ever; sleeps well.  No insomnia.    3. Health Maintenance: Last physical:  2014 Colonoscopy:  2015 TDAP: 2014 Zostavax: never Influenza:  Not yet; agreeable. Eye exam:  Due; +glasses.cataracts and ready for resection. Dental exam:  Dr. Orion Crook.    Whistle in chest with deep breathing: mother smoked 2 ppd; no cough; no fever.  Some nasal congestion, rhinorrhea.     Review of Systems  Constitutional: Negative for fever, chills, diaphoresis, activity change, appetite change, fatigue and unexpected weight change.  HENT: Negative for congestion, dental problem, drooling, ear discharge, ear pain, facial swelling, hearing loss, mouth sores, nosebleeds, postnasal drip, rhinorrhea, sinus  pressure, sneezing, sore throat, tinnitus, trouble swallowing and voice change.   Eyes: Negative for photophobia, pain, discharge, redness, itching and visual disturbance.  Respiratory: Negative for apnea, cough, choking, chest tightness, shortness of breath, wheezing and stridor.   Cardiovascular: Negative for chest pain, palpitations and leg swelling.  Gastrointestinal: Positive for abdominal pain. Negative for nausea, vomiting, diarrhea, constipation and blood in stool.  Endocrine: Negative for cold intolerance, heat intolerance, polydipsia, polyphagia and polyuria.  Genitourinary: Negative for dysuria, urgency, frequency, hematuria, flank pain, decreased urine volume, discharge, penile swelling, scrotal swelling, enuresis, difficulty urinating, genital sores, penile pain and testicular pain.  Musculoskeletal: Positive for back pain. Negative for myalgias, joint swelling, arthralgias, gait problem, neck pain and neck stiffness.  Skin: Negative for color change, pallor, rash and wound.  Allergic/Immunologic: Negative for environmental allergies, food allergies and immunocompromised state.  Neurological: Negative for dizziness, tremors, seizures, syncope, facial asymmetry, speech difficulty, weakness, light-headedness, numbness and headaches.  Hematological: Negative for adenopathy. Does not bruise/bleed easily.  Psychiatric/Behavioral: Negative for suicidal ideas, hallucinations, behavioral problems, confusion, sleep disturbance, self-injury, dysphoric mood, decreased concentration and agitation. The patient is not nervous/anxious and is not hyperactive.     Past Medical History  Diagnosis Date  . Mitral valve prolapse   . Cataract   . Anxiety and depression     OCD; history of tics in adolescents. On fluoxetine.  . Benign positional vertigo   . History of chicken pox    Past Surgical History  Procedure Laterality  Date  . Tonsilectomy, adenoidectomy, bilateral myringotomy and tubes      Allergies  Allergen Reactions  . Sulfa Antibiotics Nausea And Vomiting   Current Outpatient Prescriptions  Medication Sig Dispense Refill  . aspirin 81 MG tablet Take 81 mg by mouth daily.    . fish oil-omega-3 fatty acids 1000 MG capsule Take 2 g by mouth daily.    . Multiple Vitamins-Minerals (MULTIVITAMIN WITH MINERALS) tablet Take 1 tablet by mouth daily.    Francella Solian Johns Wort 1000 MG CAPS Take by mouth.    . zoster vaccine live, PF, (ZOSTAVAX) 16109 UNT/0.65ML injection Inject 19,400 Units into the skin once. 1 each 0   No current facility-administered medications for this visit.   Social History   Social History  . Marital Status: Married    Spouse Name: N/A  . Number of Children: 4  . Years of Education: N/A   Occupational History  . 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 Smoking status: Never Smoker   . Smokeless tobacco: Not on file  . Alcohol Use: 1.2 oz/week    1 Glasses of wine, 1 Cans of beer per week     Comment: 1 drink daily in the past  . Drug Use: No  . Sexual Activity:    Partners: Female     Comment: married   Other Topics Concern  . Not on file   Social History Narrative   Marital status:  Married x 31 years      Children: 4 (3 biological, 1 adopted).  No grandchildren; one on the way in 2017.      Lives: with wife, 3 children.      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      Tobacco: none      Alcohol: one drink per night with dinner.  3-4 drinks per week.  Beer and wine.      Drugs:  None      Exercise:  Walking. Weight lifting 3 days per week.       Diet: healthy      Seatbelts: 100%      Guns: none      Education: College/Other.   Religion: [place], good support    Family History  Problem Relation Age of Onset  . Heart disease Father 42    CHF; smoking/tobacco  . Diverticulitis Brother   . Mental illness Brother   . Heart disease Paternal Grandfather   . Abnormal EKG  Paternal Grandfather   . Osteoporosis Mother   . Mental illness Brother   . Cancer Maternal Grandmother   . Hypertension Paternal Grandmother   . Lung cancer Maternal Grandfather        Objective:    BP 118/70 mmHg  Pulse 71  Temp(Src) 97.9 F (36.6 C) (Oral)  Resp 16  Ht 6' 1.25" (1.861 m)  Wt 173 lb 9.6 oz (78.744 kg)  BMI 22.74 kg/m2  SpO2 97% Physical Exam  Constitutional: He is oriented to person, place, and time. He appears well-developed and well-nourished. No distress.  HENT:  Head: Normocephalic and atraumatic.  Right Ear: External ear normal.  Left Ear: External ear normal.  Nose: Nose normal.  Mouth/Throat: Oropharynx is clear and moist.  Eyes: Conjunctivae and EOM are normal. Pupils are equal, round, and reactive to light.  Neck: Normal range of motion. Neck supple. Carotid bruit is not present. No thyromegaly present.  Cardiovascular:  Normal rate, regular rhythm, normal heart sounds and intact distal pulses.  Exam reveals no gallop and no friction rub.   No murmur heard. Pulmonary/Chest: Effort normal and breath sounds normal. He has no wheezes. He has no rales.  Abdominal: Soft. Bowel sounds are normal. He exhibits no distension and no mass. There is no tenderness. There is no rebound and no guarding. Hernia confirmed negative in the right inguinal area and confirmed negative in the left inguinal area.  Genitourinary: Rectum normal, prostate normal, testes normal and penis normal. Prostate is not enlarged and not tender. No penile tenderness.  Musculoskeletal:       Right shoulder: Normal.       Left shoulder: Normal.       Left hip: Normal. He exhibits normal range of motion, normal strength, no tenderness, no bony tenderness and no swelling.       Cervical back: Normal.       Lumbar back: Normal. He exhibits normal range of motion, no tenderness, no bony tenderness and no swelling.  Lymphadenopathy:    He has no cervical adenopathy.       Right: No inguinal  adenopathy present.       Left: No inguinal adenopathy present.  Neurological: He is alert and oriented to person, place, and time. He has normal reflexes. No cranial nerve deficit. He exhibits normal muscle tone. Coordination normal.  Skin: Skin is warm and dry. No rash noted. He is not diaphoretic.  Psychiatric: He has a normal mood and affect. His behavior is normal. Judgment and thought content normal.  Nursing note and vitals reviewed.  Results for orders placed or performed in visit on 11/11/13  Urine culture  Result Value Ref Range   Colony Count NO GROWTH    Organism ID, Bacteria NO GROWTH   Rocky mtn spotted fvr ab, IgM-blood  Result Value Ref Range   ROCKY MTN SPOTTED FEVER, IGM 0.28 IV  CK  Result Value Ref Range   Total CK 71 7 - 232 U/L  POCT CBC  Result Value Ref Range   WBC 10.6 (A) 4.6 - 10.2 K/uL   Lymph, poc 1.4 0.6 - 3.4   POC LYMPH PERCENT 13.2 10 - 50 %L   MID (cbc) 0.7 0 - 0.9   POC MID % 6.2 0 - 12 %M   POC Granulocyte 8.5 (A) 2 - 6.9   Granulocyte percent 80.6 (A) 37 - 80 %G   RBC 5.41 4.69 - 6.13 M/uL   Hemoglobin 16.2 14.1 - 18.1 g/dL   HCT, POC 49.7 43.5 - 53.7 %   MCV 91.9 80 - 97 fL   MCH, POC 29.9 27 - 31.2 pg   MCHC 32.6 31.8 - 35.4 g/dL   RDW, POC 13.2 %   Platelet Count, POC 338 142 - 424 K/uL   MPV 10.1 0 - 99.8 fL  POCT UA - Microscopic Only  Result Value Ref Range   WBC, Ur, HPF, POC 0-1    RBC, urine, microscopic 0-1    Bacteria, U Microscopic neg    Mucus, UA neg    Epithelial cells, urine per micros 0-1    Crystals, Ur, HPF, POC neg    Casts, Ur, LPF, POC neg    Yeast, UA neg   POCT urinalysis dipstick  Result Value Ref Range   Color, UA yellow    Clarity, UA clear    Glucose, UA neg    Bilirubin, UA neg    Ketones,  UA neg    Spec Grav, UA 1.015    Blood, UA neg    pH, UA 6.5    Protein, UA neg    Urobilinogen, UA 0.2    Nitrite, UA neg    Leukocytes, UA Negative        Assessment & Plan:   1. Routine physical  examination   2. Screening, lipid   3. Screening for diabetes mellitus   4. Screening for HIV (human immunodeficiency virus)   5. Need for hepatitis C screening test   6. Nocturia   7. Abdominal pain, RLQ   8. Anxiety and depression   9. Need for Zostavax administration   10. Need for prophylactic vaccination and inoculation against influenza     Orders Placed This Encounter  Procedures  . Flu Vaccine QUAD 36+ mos IM  . CBC with Differential/Platelet  . Comprehensive metabolic panel    Order Specific Question:  Has the patient fasted?    Answer:  Yes  . Hemoglobin A1c  . Lipid panel    Order Specific Question:  Has the patient fasted?    Answer:  Yes  . HIV antibody  . TSH  . PSA  . Hepatitis C antibody  . POCT urinalysis dipstick  . POCT Microscopic Urinalysis ([facility])   Meds ordered this encounter  Medications  . aspirin 81 MG tablet    Sig: Take 81 mg by mouth daily.  Francella Solian Johns Wort 1000 MG CAPS    Sig: Take by mouth.  . zoster vaccine live, PF, (ZOSTAVAX) 96295 UNT/0.65ML injection    Sig: Inject 19,400 Units into the skin once.    Dispense:  1 each    Refill:  0    Return in about 1 year (around 07/28/2016) for complete physical examiniation.    Deaundra Dupriest Elayne Guerin, M.D. [facility] [address] [place], [place]  [address] [phone] phone [phone] fax

## 2015-07-29 NOTE — Patient Instructions (Signed)
Piriformis Syndrome With Rehab Piriformis syndrome is a condition the affects the nervous system in the area of the hip, and is characterized by pain and possibly a loss of feeling in the backside (posterior) thigh that may extend down the entire length of the leg. The symptoms are caused by an increase in pressure on the sciatic nerve by the piriformis muscle, which is on the back of the hip and is responsible for externally rotating the hip. The sciatic nerve and its branches connect to much of the leg. Normally the sciatic nerve runs between the piriformis muscle and other muscles. However, in certain individuals the nerve runs through the muscle, which causes an increase in pressure on the nerve and results in the symptoms of piriformis syndrome. SYMPTOMS   Pain, tingling, numbness, or burning in the back of the thigh that may also extend down the entire leg.  Occasionally, tenderness in the buttock.  Loss of function of the leg.  Pain that worsens when using the piriformis muscle (running, jumping, or stairs).  Pain that increases with prolonged sitting.  Pain that is lessened by lying flat on the back. CAUSES   Piriformis syndrome is the result of an increase in pressure placed on the sciatic nerve. Oftentimes, piriformis syndrome is an overuse injury.  Stress placed on the nerve from a sudden increase in the intensity, frequency, or duration of training.  Compensation of other extremity injuries. RISK INCREASES WITH:  Sports that involve the piriformis muscle (running, walking, or jumping).  You are born with (congenital) a defect in which the sciatic nerve passes through the muscle. PREVENTION  Warm up and stretch properly before activity.  Allow for adequate recovery between workouts.  Maintain physical fitness:  Strength, flexibility, and endurance.  Cardiovascular fitness. PROGNOSIS  If treated properly, the symptoms of piriformis syndrome usually resolve in 2 to 6  weeks. RELATED COMPLICATIONS   Persistent and possibly permanent pain and numbness in the lower extremity.  Weakness of the extremity that may progress to disability and inability to compete. TREATMENT  The most effective treatment for piriformis syndrome is rest from any activities that aggravate the symptoms. Ice and pain medication may help reduce pain and inflammation. The use of strengthening and stretching exercises may help reduce pain with activity. These exercises may be performed at home or with a therapist. A referral to a therapist may be given for further evaluation and treatment, such as ultrasound. Corticosteroid injections may be given to reduce inflammation that is causing pressure to be placed on the sciatic nerve. If nonsurgical (conservative) treatment is unsuccessful, then surgery may be recommended.  MEDICATION   If pain medication is necessary, then nonsteroidal anti-inflammatory medications, such as aspirin and ibuprofen, or other minor pain relievers, such as acetaminophen, are often recommended.  Do not take pain medication for 7 days before surgery.  Prescription pain relievers may be given if deemed necessary by your caregiver. Use only as directed and only as much as you need.  Corticosteroid injections may be given by your caregiver. These injections should be reserved for the most serious cases, because they may only be given a certain number of times. HEAT AND COLD:   Cold treatment (icing) relieves pain and reduces inflammation. Cold treatment should be applied for 10 to 15 minutes every 2 to 3 hours for inflammation and pain and immediately after any activity that aggravates your symptoms. Use ice packs or massage the area with a piece of ice (ice massage).  Heat   treatment may be used prior to performing the stretching and strengthening activities prescribed by your caregiver, physical therapist, or athletic trainer. Use a heat pack or soak the injury in warm  water. SEEK IMMEDIATE MEDICAL CARE IF:  Treatment seems to offer no benefit, or the condition worsens.  Any medications produce adverse side effects. EXERCISES RANGE OF MOTION (ROM) AND STRETCHING EXERCISES - Piriformis Syndrome These exercises may help you when beginning to rehabilitate your injury. Your symptoms may resolve with or without further involvement from your physician, physical therapist, or athletic trainer. While completing these exercises, remember:   Restoring tissue flexibility helps normal motion to return to the joints. This allows healthier, less painful movement and activity.  An effective stretch should be held for at least 30 seconds.  A stretch should never be painful. You should only feel a gentle lengthening or release in the stretched tissue. STRETCH - Hip Rotators  Lie on your back on a firm surface. Grasp your right / left knee with your right / left hand and your ankle with your opposite hand.  Keeping your hips and shoulders firmly planted, gently pull your right / left knee and rotate your lower leg toward your opposite shoulder until you feel a stretch in your buttocks.  Hold this stretch for __________ seconds. Repeat this stretch __________ times. Complete this stretch __________ times per day. STRETCH - Iliotibial Band  On the floor or bed, lie on your side so your right / left leg is on top. Bend your knee and grab your ankle.  Slowly bring your knee back so that your thigh is in line with your trunk. Keep your heel at your buttocks and gently arch your back so your head, shoulders, and hips line up.  Slowly lower your leg so that your knee approaches the floor/bed until you feel a gentle stretch on the outside of your right / left thigh. If you do not feel a stretch and your knee will not fall farther, place the heel of your opposite foot on top of your knee and pull your thigh down farther.  Hold this stretch for __________ seconds. Repeat  __________ times. Complete __________ times per day. STRENGTHENING EXERCISES - Piriformis Syndrome  These are some of the caregiver again or until your symptoms are resolved. Remember:   Strong muscles with good endurance tolerate stress better.  Do the exercises as initially prescribed by your caregiver. Progress slowly with each exercise, gradually increasing the number of repetitions and weight used under their guidance. STRENGTH - Hip Abductors, Straight Leg Raises Be aware of your form throughout the entire exercise so that you exercise the correct muscles. Sloppy form means that you are not strengthening the correct muscles.  Lie on your side so that your head, shoulders, knee, and hip line up. You may bend your lower knee to help maintain your balance. Your right / left leg should be on top.  Roll your hips slightly forward, so that your hips are stacked directly over each other and your right / left knee is facing forward.  Lift your top leg up 4-6 inches, leading with your heel. Be sure that your foot does not drift forward or that your knee does not roll toward the ceiling.  Hold this position for __________ seconds. You should feel the muscles in your outer hip lifting (you may not notice this until your leg begins to tire).  Slowly lower your leg to the starting position. Allow the muscles to fully  relax before beginning the next repetition. Repeat __________ times. Complete this exercise __________ times per day.  STRENGTH - Hip Abductors, Quadruped  On a firm, lightly padded surface, position yourself on your hands and knees. Your hands should be directly below your shoulders and your knees should be directly below your hips.  Keeping your right / left knee bent, lift your leg out to the side. Keep your legs level and in line with your shoulders.  Position yourself on your hands and knees.  Hold for __________ seconds.  Keeping your trunk steady and your hips level, slowly  lower your leg to the starting position. Repeat __________ times. Complete this exercise __________ times per day.  STRENGTH - Hip Abductors, Standing  Tie one end of a rubber exercise band/tubing to a secure surface (table, pole) and tie a loop at the other end.  Place the loop around your right / left ankle. Keeping your ankle with the band directly opposite of the secured end, step away until there is tension in the tube/band.  Hold onto a chair as needed for balance.  Keeping your back upright, your shoulders over your hips, and your toes pointing forward, lift your right / left leg out to your side. Be sure to lift your leg with your hip muscles. Do not "throw" your leg or tip your body to lift your leg.  Slowly and with control, return to the starting position. Repeat exercise __________ times. Complete this exercise __________ times per day.    This information is not intended to replace advice given to you by your health care provider. Make sure you discuss any questions you have with your health care provider.   Document Released: 07/06/2005 Document Revised: 11/20/2014 Document Reviewed: 10/18/2008 Elsevier Interactive Patient Education 2016 [place] you healthy  Get these tests  Blood pressure- Have your blood pressure checked once a year by your healthcare provider.  Normal blood pressure is 120/80  Weight- Have your body mass index (BMI) calculated to screen for obesity.  BMI is a measure of body fat based on height and weight. You can also calculate your own BMI at ViewBanking.si.  Cholesterol- Have your cholesterol checked every year.  Diabetes- Have your blood sugar checked regularly if you have high blood pressure, high cholesterol, have a family history of diabetes or if you are overweight.  Screening for Colon Cancer- Colonoscopy starting at age 85.  Screening may begin sooner depending on your family history and other health conditions.  Follow up colonoscopy as directed by your Gastroenterologist.  Screening for Prostate Cancer- Both blood work (PSA) and a rectal exam help screen for Prostate Cancer.  Screening begins at age 51 with African-American men and at age 83 with Caucasian men.  Screening may begin sooner depending on your family history.  Take these medicines  Aspirin- One aspirin daily can help prevent Heart disease and Stroke.  Flu shot- Every fall.  Tetanus- Every 10 years.  Zostavax- Once after the age of 33 to prevent Shingles.  Pneumonia shot- Once after the age of 30; if you are younger than 72, ask your healthcare provider if you need a Pneumonia shot.  Take these steps  Don't smoke- If you do smoke, talk to your doctor about quitting.  For tips on how to quit, go to www.smokefree.gov or call 1-800-QUIT-NOW.  Be physically active- Exercise 5 days a week for at least 30 minutes.  If you are not already physically active start slow  and gradually work up to 30 minutes of moderate physical activity.  Examples of moderate activity include walking briskly, mowing the yard, dancing, swimming, bicycling, etc.  Eat a healthy diet- Eat a variety of healthy food such as fruits, vegetables, low fat milk, low fat cheese, yogurt, lean meant, poultry, fish, beans, tofu, etc. For more information go to www.thenutritionsource.org  Drink alcohol in moderation- Limit alcohol intake to less than two drinks a day. Never drink and drive.  Dentist- Brush and floss twice daily; visit your dentist twice a year.  Depression- Your emotional health is as important as your physical health. If you're feeling down, or losing interest in things you would normally enjoy please talk to your healthcare provider.  Eye exam- Visit your eye doctor every year.  Safe sex- If you may be exposed to a sexually transmitted infection, use a condom.  Seat belts- Seat belts can save your life; always wear one.  Smoke/Carbon Monoxide  detectors- These detectors need to be installed on the appropriate level of your home.  Replace batteries at least once a year.  Skin cancer- When out in the sun, cover up and use sunscreen 15 SPF or higher.  Violence- If anyone is threatening you, please tell your healthcare provider.  Living Will/ Health care power of attorney- Speak with your healthcare provider and family.

## 2015-07-30 LAB — HIV ANTIBODY (ROUTINE TESTING W REFLEX): HIV: NONREACTIVE

## 2015-07-30 LAB — HEPATITIS C ANTIBODY: HCV AB: NEGATIVE

## 2015-07-30 LAB — PSA: PSA: 1.71 ng/mL (ref <=4.00)

## 2015-08-13 ENCOUNTER — Other Ambulatory Visit: Payer: Self-pay | Admitting: Family Medicine

## 2015-08-13 DIAGNOSIS — N289 Disorder of kidney and ureter, unspecified: Secondary | ICD-10-CM

## 2015-11-05 ENCOUNTER — Encounter: Payer: Self-pay | Admitting: Family Medicine

## 2015-11-05 ENCOUNTER — Ambulatory Visit (INDEPENDENT_AMBULATORY_CARE_PROVIDER_SITE_OTHER): Payer: BLUE CROSS/BLUE SHIELD | Admitting: Family Medicine

## 2015-11-05 VITALS — BP 102/60 | HR 77 | Temp 98.7°F | Resp 16 | Ht 73.0 in | Wt 168.8 lb

## 2015-11-05 DIAGNOSIS — B353 Tinea pedis: Secondary | ICD-10-CM | POA: Diagnosis not present

## 2015-11-05 DIAGNOSIS — R002 Palpitations: Secondary | ICD-10-CM

## 2015-11-05 DIAGNOSIS — N289 Disorder of kidney and ureter, unspecified: Secondary | ICD-10-CM

## 2015-11-05 LAB — COMPREHENSIVE METABOLIC PANEL
ALBUMIN: 4.1 g/dL (ref 3.6–5.1)
ALK PHOS: 94 U/L (ref 40–115)
ALT: 18 U/L (ref 9–46)
AST: 21 U/L (ref 10–35)
BILIRUBIN TOTAL: 0.6 mg/dL (ref 0.2–1.2)
BUN: 15 mg/dL (ref 7–25)
CALCIUM: 9 mg/dL (ref 8.6–10.3)
CO2: 29 mmol/L (ref 20–31)
CREATININE: 1.04 mg/dL (ref 0.70–1.25)
Chloride: 103 mmol/L (ref 98–110)
Glucose, Bld: 87 mg/dL (ref 65–99)
Potassium: 4 mmol/L (ref 3.5–5.3)
Sodium: 140 mmol/L (ref 135–146)
TOTAL PROTEIN: 6 g/dL — AB (ref 6.1–8.1)

## 2015-11-05 NOTE — Patient Instructions (Signed)
     IF you received an x-ray today, you will receive an invoice from [facility]. Please contact [facility] at [phone] with questions or concerns regarding your invoice.   IF you received labwork today, you will receive an invoice from [facility]. Please contact [facility] at [phone] with questions or concerns regarding your invoice.   Our billing staff will not be able to assist you with questions regarding bills from these companies.  You will be contacted with the lab results as soon as they are available. The fastest way to get your results is to activate your My Chart account. Instructions are located on the last page of this paperwork. If you have not heard from us regarding the results in 2 weeks, please contact this office.      

## 2015-11-05 NOTE — Progress Notes (Signed)
Subjective:    Patient ID: Joseph Saunders, male    DOB: 1955-11-21, 60 y.o.   MRN: [id number]  11/05/2015  Follow-up   HPI This 60 y.o. male presents for follow-up for mild renal insufficiency detected at recent complete physical examination in 07/2015.  Creatinine elevated at 1.44.  Had been taking workout supplements at that time. Was also suffering with palpitations at that time; thus, has discontinued supplements.  Feeling well at this time. Denies chest pain, palpitations, SOB, leg swelling, orthopnea.  Foot rash: has been suffering with athlete's foot for several months; has been using a scrub brush on bottom of foot; then washed top of foot with scrub brush and now suffering with pruritic rash. Wife purchased OTC antifungal cream; started applying yesterday.   Review of Systems  Constitutional: Negative for fever, chills, diaphoresis and fatigue.  Respiratory: Negative for shortness of breath.   Cardiovascular: Negative for chest pain, palpitations and leg swelling.  Skin: Positive for rash.    Past Medical History  Diagnosis Date  . Mitral valve prolapse   . Cataract   . Anxiety and depression     OCD; history of tics in adolescents. On fluoxetine.  . Benign positional vertigo   . History of chicken pox    Past Surgical History  Procedure Laterality Date  . Tonsilectomy, adenoidectomy, bilateral myringotomy and tubes     Allergies  Allergen Reactions  . Sulfa Antibiotics Nausea And Vomiting   Current Outpatient Prescriptions  Medication Sig Dispense Refill  . aspirin 81 MG tablet Take 81 mg by mouth daily.    . fish oil-omega-3 fatty acids 1000 MG capsule Take 2 g by mouth daily.    . Multiple Vitamins-Minerals (MULTIVITAMIN WITH MINERALS) tablet Take 1 tablet by mouth daily.    Francella Solian Johns Wort 1000 MG CAPS Take by mouth.    . zoster vaccine live, PF, (ZOSTAVAX) 16109 UNT/0.65ML injection Inject 19,400 Units into the skin once. 1 each 0   No current  facility-administered medications for this visit.   Social History   Social History  . Marital Status: Married    Spouse Name: N/A  . Number of Children: 4  . Years of Education: N/A   Occupational History  . 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 Smoking status: Never Smoker   . Smokeless tobacco: Not on file  . Alcohol Use: 1.2 oz/week    1 Glasses of wine, 1 Cans of beer per week     Comment: 1 drink daily in the past  . Drug Use: No  . Sexual Activity:    Partners: Female     Comment: married   Other Topics Concern  . Not on file   Social History Narrative   Marital status:  Married x 31 years      Children: 4 (3 biological, 1 adopted).  No grandchildren; one on the way in 2017.      Lives: with wife, 3 children.      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      Tobacco: none      Alcohol: one drink per night with dinner.  3-4 drinks per week.  Beer and wine.      Drugs:  None      Exercise:  Walking. Weight lifting 3 days per week.       Diet: healthy      Seatbelts: 100%  Guns: none      Education: College/Other.   Religion: Avis, good support    Family History  Problem Relation Age of Onset  . Heart disease Father 31    CHF; smoking/tobacco  . Diverticulitis Brother   . Mental illness Brother   . Heart disease Paternal Grandfather   . Abnormal EKG Paternal Grandfather   . Osteoporosis Mother   . Mental illness Brother   . Cancer Maternal Grandmother   . Hypertension Paternal Grandmother   . Lung cancer Maternal Grandfather        Objective:    BP 102/60 mmHg  Pulse 77  Temp(Src) 98.7 F (37.1 C) (Oral)  Resp 16  Ht 6\' 1"  (1.854 m)  Wt 168 lb 12.8 oz (76.567 kg)  BMI 22.28 kg/m2  SpO2 98% Physical Exam  Constitutional: He is oriented to person, place, and time. He appears well-developed and well-nourished. No distress.  HENT:  Head: Normocephalic and atraumatic.  Eyes:  Conjunctivae and EOM are normal. Pupils are equal, round, and reactive to light.  Neck: Normal range of motion. Neck supple. Carotid bruit is not present. No thyromegaly present.  Cardiovascular: Normal rate, regular rhythm, normal heart sounds and intact distal pulses.  Exam reveals no gallop and no friction rub.   No murmur heard. Pulmonary/Chest: Effort normal and breath sounds normal. He has no wheezes. He has no rales.  Lymphadenopathy:    He has no cervical adenopathy.  Neurological: He is alert and oriented to person, place, and time. No cranial nerve deficit.  Skin: Skin is warm and dry. Rash noted. He is not diaphoretic.  Diffuse erythematous rash dorsal aspect of R foot.  Psychiatric: He has a normal mood and affect. His behavior is normal.  Nursing note and vitals reviewed.       Assessment & Plan:   1. Renal insufficiency   2. Tinea pedis of right foot   3. Palpitations     1. Renal insufficiency:  New. Repeat today; asymptomatic. 2.  Palpitations: resolved with discontinuation of supplement. 3.  Tinea pedis R: New.  Using OTC antifungal; to call if no improvement in two weeks.   Orders Placed This Encounter  Procedures  . Comprehensive metabolic panel   No orders of the defined types were placed in this encounter.    No Follow-up on file.    Rayonna Heldman Elayne Guerin, M.D. [facility] [address] [place], [place]  [address] [phone] phone [phone] fax

## 2017-09-14 NOTE — Progress Notes (Deleted)
Subjective:    Patient ID: Joseph Saunders, male    DOB: 1956/01/29, 62 y.o.   MRN: [id number]  09/15/2017  Annual Exam    HPI This 62 y.o. male presents for evaluation of ***. BP Readings from Last 3 Encounters:  09/15/17 102/68  11/05/15 102/60  07/29/15 118/70   Wt Readings from Last 3 Encounters:  09/15/17 163 lb (73.9 kg)  11/05/15 168 lb 12.8 oz (76.6 kg)  07/29/15 173 lb 9.6 oz (78.7 kg)   Immunization History  Administered Date(s) Administered  . Influenza,inj,Quad PF,6+ Mos 05/24/2013, 07/29/2015  . Influenza-Unspecified 08/13/2016, 06/17/2017  . Tdap 05/24/2013    Review of Systems  Constitutional: Negative for activity change, appetite change, chills, diaphoresis, fatigue, fever and unexpected weight change.  HENT: Positive for dental problem. Negative for congestion, drooling, ear discharge, ear pain, facial swelling, hearing loss, mouth sores, nosebleeds, postnasal drip, rhinorrhea, sinus pressure, sinus pain, sneezing, sore throat, tinnitus, trouble swallowing and voice change.   Eyes: Negative for photophobia, pain, discharge, redness, itching and visual disturbance.  Respiratory: Negative for apnea, cough, choking, chest tightness, shortness of breath, wheezing and stridor.   Cardiovascular: Negative for chest pain, palpitations and leg swelling.  Gastrointestinal: Positive for abdominal distention, abdominal pain and anal bleeding. Negative for blood in stool, constipation, diarrhea, nausea and vomiting.  Endocrine: Negative for cold intolerance, heat intolerance, polydipsia, polyphagia and polyuria.  Genitourinary: Negative for decreased urine volume, difficulty urinating, discharge, dysuria, enuresis, flank pain, frequency, genital sores, hematuria, penile pain, penile swelling, scrotal swelling, testicular pain and urgency.       Nocturia x 1-2.  Urinary stream slightly weaker at times; chronic.    Musculoskeletal: Positive for arthralgias. Negative for  back pain, gait problem, joint swelling, myalgias, neck pain and neck stiffness.  Skin: Negative for color change, pallor, rash and wound.  Allergic/Immunologic: Negative for environmental allergies, food allergies and immunocompromised state.  Neurological: Positive for dizziness. Negative for tremors, seizures, syncope, facial asymmetry, speech difficulty, weakness, light-headedness, numbness and headaches.  Hematological: Negative for adenopathy. Does not bruise/bleed easily.  Psychiatric/Behavioral: Negative for agitation, behavioral problems, confusion, decreased concentration, dysphoric mood, hallucinations, self-injury, sleep disturbance and suicidal ideas. The patient is not nervous/anxious and is not hyperactive.        Bedtime 1000-1100; wakes up 700.    Past Medical History:  Diagnosis Date  . 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 On fluoxetine.  . Benign positional vertigo   . Cataract   . History of chicken pox   . Mitral valve prolapse    Past Surgical History:  Procedure Laterality Date  . TONSILECTOMY, ADENOIDECTOMY, BILATERAL MYRINGOTOMY AND TUBES     Allergies  Allergen Reactions  . Sulfa Antibiotics Nausea And Vomiting   Current Outpatient Medications on File Prior to Visit  Medication Sig Dispense Refill  . aspirin 81 MG tablet Take 81 mg by mouth daily.    . fish oil-omega-3 fatty acids 1000 MG capsule Take 2 g by mouth daily.    Marland Kitchen FLUZONE QUADRIVALENT 0.5 ML injection ADM 0.5ML IM UTD  0  . Magnesium Carbonate (MAGNESIUM GLUCONATE) 54mg /83ml syringe Take by mouth.    . Multiple Vitamin (MULTI-VITAMINS) TABS Take by mouth.    Francella Solian Johns Wort 1000 MG CAPS Take by mouth.    . zoster vaccine live, PF, (ZOSTAVAX) 72536 UNT/0.65ML injection Inject 19,400 Units into the skin once. 1 each 0   No current facility-administered medications on file  prior to visit.    Social History   Socioeconomic History  . Marital status: Married    Spouse  name: Not on file  . Number of children: 4  . Years of education: Not on file  . Highest education level: Not on file  Social Needs  . Financial resource strain: Not on file  . Food insecurity - worry: Not on file  . Food insecurity - inability: Not on file  . Transportation needs - medical: Not on file  . Transportation needs - non-medical: Not on file  Occupational History  . 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 Smoking status: Never Smoker  . Smokeless tobacco: Never Used  Substance and Sexual Activity  . Alcohol use: Yes    Alcohol/week: 1.2 oz    Types: 1 Glasses of wine, 1 Cans of beer per week    Comment: 1 drink daily in the past  . Drug use: No  . Sexual activity: Yes    Partners: Female    Comment: married  Other Topics Concern  . Not on file  Social History Narrative   Marital status:  Married x 31 years      Children: 4 (3 biological, 1 adopted).  No grandchildren; one on the way in 2017.      Lives: with wife, 3 children.      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      Tobacco: none      Alcohol: one drink per night with dinner.  3-4 drinks per week.  Beer and wine.      Drugs:  None      Exercise:  Walking. Weight lifting 3 days per week.       Diet: healthy      Seatbelts: 100%      Guns: none      Education: College/Other.   Religion: [place], good support    Family History  Problem Relation Age of Onset  . Heart disease Father 94       CHF; smoking/tobacco  . Diverticulitis Brother   . Mental illness Brother   . Heart disease Paternal Grandfather   . Abnormal EKG Paternal Grandfather   . Osteoporosis Mother   . Mental illness Brother   . Cancer Maternal Grandmother   . Hypertension Paternal Grandmother   . Lung cancer Maternal Grandfather        Objective:    BP 102/68   Pulse 88   Temp 98 F (36.7 C) (Oral)   Resp 16   Ht 6' 1.43" (1.865 m)   Wt 163 lb (73.9 kg)    SpO2 97%   BMI 21.26 kg/m  Physical Exam  Constitutional: He is oriented to person, place, and time. He appears well-developed and well-nourished. No distress.  HENT:  Head: Normocephalic and atraumatic.  Right Ear: External ear normal.  Left Ear: External ear normal.  Nose: Nose normal.  Mouth/Throat: Oropharynx is clear and moist.  Eyes: Conjunctivae and EOM are normal. Pupils are equal, round, and reactive to light.  Neck: Normal range of motion. Neck supple. Carotid bruit is not present. No thyromegaly present.  Cardiovascular: Normal rate, regular rhythm, normal heart sounds and intact distal pulses. Exam reveals no gallop and no friction rub.  No murmur heard. Pulmonary/Chest: Effort normal and breath sounds normal. He has no wheezes. He has no rales.  Abdominal: Soft. Bowel sounds are normal. He exhibits no distension  and no mass. There is no tenderness. There is no rebound and no guarding. Hernia confirmed negative in the right inguinal area and confirmed negative in the left inguinal area.  Genitourinary: Rectum normal, prostate normal, testes normal and penis normal.  Musculoskeletal:       Right shoulder: Normal.       Left shoulder: Normal.       Cervical back: Normal.  Lymphadenopathy:    He has no cervical adenopathy.       Right: No inguinal adenopathy present.       Left: No inguinal adenopathy present.  Neurological: He is alert and oriented to person, place, and time. He has normal reflexes. No cranial nerve deficit. He exhibits normal muscle tone. Coordination normal.  Skin: Skin is warm and dry. No rash noted. He is not diaphoretic.  Psychiatric: He has a normal mood and affect. His behavior is normal. Judgment and thought content normal.   No results found. Depression screen [facility] 2/9 09/15/2017 11/05/2015 07/29/2015  Decreased Interest 0 0 0  Down, Depressed, Hopeless 0 0 -  PHQ - 2 Score 0 0 0   Fall Risk  09/15/2017 11/05/2015 07/29/2015  Falls in the past year? No  No Yes  Number falls in past yr: - - 1  Injury with Fall? - - Yes  Comment - - fracture right hand         Assessment & Plan:   1. Routine physical examination   2. Screening, lipid   3. Screening for diabetes mellitus   4. Screening for prostate cancer   5. Palpitations     -anticipatory guidance provided --- exercise, weight loss, safe driving practices, aspirin 81mg  daily. -obtain age appropriate screening labs and labs for chronic disease management. -unintentional weight loss over the past six months due to lack of exercise; starting exercise program back this month; obtain labs to rule out secondary causes; RTC if weight loss continues despite exercise. -history of intermittent palpitations; EKG obtained during visit.   Orders Placed This Encounter  Procedures  . CBC with Differential/Platelet  . Comprehensive metabolic panel    Order Specific Question:   Has the patient fasted?    Answer:   No  . Hemoglobin A1c  . Lipid panel    Order Specific Question:   Has the patient fasted?    Answer:   No  . PSA  . TSH  . POCT urinalysis dipstick  . EKG 12-Lead   No orders of the defined types were placed in this encounter.   Return in about 1 year (around 09/15/2018) for complete physical examiniation.   Jupiter Kabir Elayne Guerin, M.D. Primary Care at [facility] previously [facility] [place] [address] [place], [place]  [address] [phone] phone [phone] fax

## 2017-09-15 ENCOUNTER — Other Ambulatory Visit: Payer: Self-pay

## 2017-09-15 ENCOUNTER — Ambulatory Visit (INDEPENDENT_AMBULATORY_CARE_PROVIDER_SITE_OTHER): Payer: BLUE CROSS/BLUE SHIELD | Admitting: Family Medicine

## 2017-09-15 ENCOUNTER — Encounter: Payer: Self-pay | Admitting: Family Medicine

## 2017-09-15 VITALS — BP 102/68 | HR 88 | Temp 98.0°F | Resp 16 | Ht 73.43 in | Wt 163.0 lb

## 2017-09-15 DIAGNOSIS — Z13220 Encounter for screening for lipoid disorders: Secondary | ICD-10-CM

## 2017-09-15 DIAGNOSIS — Z125 Encounter for screening for malignant neoplasm of prostate: Secondary | ICD-10-CM

## 2017-09-15 DIAGNOSIS — R002 Palpitations: Secondary | ICD-10-CM

## 2017-09-15 DIAGNOSIS — Z0000 Encounter for general adult medical examination without abnormal findings: Secondary | ICD-10-CM | POA: Diagnosis not present

## 2017-09-15 DIAGNOSIS — Z131 Encounter for screening for diabetes mellitus: Secondary | ICD-10-CM

## 2017-09-15 LAB — POCT URINALYSIS DIP (MANUAL ENTRY)
Bilirubin, UA: NEGATIVE
Glucose, UA: NEGATIVE mg/dL
Ketones, POC UA: NEGATIVE mg/dL
Leukocytes, UA: NEGATIVE
NITRITE UA: NEGATIVE
PROTEIN UA: NEGATIVE mg/dL
RBC UA: NEGATIVE
Spec Grav, UA: 1.015 (ref 1.010–1.025)
UROBILINOGEN UA: 0.2 U/dL
pH, UA: 7 (ref 5.0–8.0)

## 2017-09-15 MED ORDER — ZOSTER VAC RECOMB ADJUVANTED 50 MCG/0.5ML IM SUSR: 0.5000 mL | Freq: Once | INTRAMUSCULAR | 1 refills | Status: AC

## 2017-09-15 NOTE — Progress Notes (Signed)
Subjective:    Patient ID: Joseph Saunders, male    DOB: 10-15-1955, 62 y.o.   MRN: [id number]  09/15/2017  Annual Exam    HPI This 61 y.o. male presents for evaluation of Complete Physical Examination.   Visual Acuity Screening   Right eye Left eye Both eyes  Without correction:     With correction: 20/20 20/20 20/20     BP Readings from Last 3 Encounters:  09/15/17 102/68  11/05/15 102/60  07/29/15 118/70   Wt Readings from Last 3 Encounters:  09/15/17 163 lb (73.9 kg)  11/05/15 168 lb 12.8 oz (76.6 kg)  07/29/15 173 lb 9.6 oz (78.7 kg)   Immunization History  Administered Date(s) Administered  . Influenza,inj,Quad PF,6+ Mos 05/24/2013, 07/29/2015  . Influenza-Unspecified 08/13/2016, 06/17/2017  . Tdap 05/24/2013   Health Maintenance  Topic Date Due  . TETANUS/TDAP  05/25/2023  . COLONOSCOPY  08/10/2023  . INFLUENZA VACCINE  Completed  . Hepatitis C Screening  Completed  . HIV Screening  Completed    Review of Systems  Constitutional: Negative for activity change, appetite change, chills, diaphoresis, fatigue, fever and unexpected weight change.  HENT: Positive for dental problem. Negative for congestion, drooling, ear discharge, ear pain, facial swelling, hearing loss, mouth sores, nosebleeds, postnasal drip, rhinorrhea, sinus pressure, sneezing, sore throat, tinnitus, trouble swallowing and voice change.   Eyes: Negative for photophobia, pain, discharge, redness, itching and visual disturbance.  Respiratory: Negative for apnea, cough, choking, chest tightness, shortness of breath, wheezing and stridor.   Cardiovascular: Negative for chest pain, palpitations and leg swelling.  Gastrointestinal: Positive for abdominal distention, abdominal pain, anal bleeding and constipation. Negative for blood in stool, diarrhea, nausea, rectal pain and vomiting.  Endocrine: Negative for cold intolerance, heat intolerance, polydipsia, polyphagia and polyuria.    Genitourinary: Negative for decreased urine volume, difficulty urinating, discharge, dysuria, enuresis, flank pain, frequency, genital sores, hematuria, penile pain, penile swelling, scrotal swelling, testicular pain and urgency.       Nocturia x 1-2.  Urinary stream slightly weaker at times yet unchanged.  Musculoskeletal: Positive for arthralgias. Negative for back pain, gait problem, joint swelling, myalgias, neck pain and neck stiffness.  Skin: Negative for color change, pallor, rash and wound.  Allergic/Immunologic: Negative for environmental allergies, food allergies and immunocompromised state.  Neurological: Positive for dizziness. Negative for tremors, seizures, syncope, facial asymmetry, speech difficulty, weakness, light-headedness, numbness and headaches.  Hematological: Negative for adenopathy. Does not bruise/bleed easily.  Psychiatric/Behavioral: Negative for agitation, behavioral problems, confusion, decreased concentration, dysphoric mood, hallucinations, self-injury, sleep disturbance and suicidal ideas. The patient is not nervous/anxious and is not hyperactive.        Bedtime 1000-1100; wakes up 700.    Past Medical History:  Diagnosis Date  . 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 On fluoxetine.  . Benign positional vertigo   . Cataract   . History of chicken pox   . Mitral valve prolapse    Past Surgical History:  Procedure Laterality Date  . TONSILECTOMY, ADENOIDECTOMY, BILATERAL MYRINGOTOMY AND TUBES     Allergies  Allergen Reactions  . Sulfa Antibiotics Nausea And Vomiting   Current Outpatient Medications on File Prior to Visit  Medication Sig Dispense Refill  . aspirin 81 MG tablet Take 81 mg by mouth daily.    . fish oil-omega-3 fatty acids 1000 MG capsule Take 2 g by mouth daily.    Marland Kitchen FLUZONE QUADRIVALENT 0.5 ML injection ADM 0.5ML IM UTD  0  . Magnesium Carbonate (MAGNESIUM GLUCONATE) 54mg /52ml syringe Take by mouth.    . Multiple  Vitamin (MULTI-VITAMINS) TABS Take by mouth.    Francella Solian Johns Wort 1000 MG CAPS Take by mouth.    . zoster vaccine live, PF, (ZOSTAVAX) 38756 UNT/0.65ML injection Inject 19,400 Units into the skin once. 1 each 0   No current facility-administered medications on file prior to visit.    Social History   Socioeconomic History  . Marital status: Married    Spouse name: Not on file  . Number of children: 4  . Years of education: Not on file  . Highest education level: Not on file  Social Needs  . Financial resource strain: Not on file  . Food insecurity - worry: Not on file  . Food insecurity - inability: Not on file  . Transportation needs - medical: Not on file  . Transportation needs - non-medical: Not on file  Occupational History  . 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 Smoking status: Never Smoker  . Smokeless tobacco: Never Used  Substance and Sexual Activity  . Alcohol use: Yes    Alcohol/week: 1.2 oz    Types: 1 Glasses of wine, 1 Cans of beer per week    Comment: 1 drink daily in the past  . Drug use: No  . Sexual activity: Yes    Partners: Female    Comment: married  Other Topics Concern  . Not on file  Social History Narrative   Marital status:  Married x 31 years      Children: 4 (3 biological, 1 adopted).  No grandchildren; one on the way in 2017.      Lives: with wife, 3 children.      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      Tobacco: none      Alcohol: one drink per night with dinner.  3-4 drinks per week.  Beer and wine.      Drugs:  None      Exercise:  Walking. Weight lifting 3 days per week.       Diet: healthy      Seatbelts: 100%      Guns: none      Education: College/Other.   Religion: [place], good support    Family History  Problem Relation Age of Onset  . Heart disease Father 82       CHF; smoking/tobacco  . Diverticulitis Brother   . Mental illness Brother   . Heart  disease Paternal Grandfather   . Abnormal EKG Paternal Grandfather   . Osteoporosis Mother   . Mental illness Brother   . Cancer Maternal Grandmother   . Hypertension Paternal Grandmother   . Lung cancer Maternal Grandfather        Objective:    BP 102/68   Pulse 88   Temp 98 F (36.7 C) (Oral)   Resp 16   Ht 6' 1.43" (1.865 m)   Wt 163 lb (73.9 kg)   SpO2 97%   BMI 21.26 kg/m  Physical Exam  Constitutional: He is oriented to person, place, and time. He appears well-developed and well-nourished. No distress.  HENT:  Head: Normocephalic and atraumatic.  Right Ear: External ear normal.  Left Ear: External ear normal.  Nose: Nose normal.  Mouth/Throat: Oropharynx is clear and moist.  Eyes: Conjunctivae and EOM are normal. Pupils are equal, round, and reactive to light.  Neck: Normal range of motion. Neck supple. Carotid bruit is not present. No thyromegaly present.  Cardiovascular: Normal rate, regular rhythm, normal heart sounds and intact distal pulses. Exam reveals no gallop and no friction rub.  No murmur heard. Pulmonary/Chest: Effort normal and breath sounds normal. He has no wheezes. He has no rales.  Abdominal: Soft. Bowel sounds are normal. He exhibits no distension and no mass. There is no tenderness. There is no rebound and no guarding. Hernia confirmed negative in the right inguinal area and confirmed negative in the left inguinal area.  Genitourinary: Rectum normal, prostate normal, testes normal and penis normal.  Musculoskeletal:       Right shoulder: Normal.       Left shoulder: Normal.       Cervical back: Normal.  Lymphadenopathy:    He has no cervical adenopathy.       Right: No inguinal adenopathy present.       Left: No inguinal adenopathy present.  Neurological: He is alert and oriented to person, place, and time. He has normal reflexes. No cranial nerve deficit. He exhibits normal muscle tone. Coordination normal.  Skin: Skin is warm and dry. No rash  noted. He is not diaphoretic.  Psychiatric: He has a normal mood and affect. His behavior is normal. Judgment and thought content normal.   No results found. Depression screen [facility] 2/9 09/15/2017 11/05/2015 07/29/2015  Decreased Interest 0 0 0  Down, Depressed, Hopeless 0 0 -  PHQ - 2 Score 0 0 0   Fall Risk  09/15/2017 11/05/2015 07/29/2015  Falls in the past year? No No Yes  Number falls in past yr: - - 1  Injury with Fall? - - Yes  Comment - - fracture right hand         Assessment & Plan:   1. Routine physical examination   2. Screening, lipid   3. Screening for diabetes mellitus   4. Screening for prostate cancer   5. Palpitations     -anticipatory guidance provided --- exercise, weight loss, safe driving practices, aspirin 81mg  daily. -obtain age appropriate screening labs and labs for chronic disease management. -unintentional weight loss over past six months; obtain labs to rule out secondary causes; pt feels due to lack of weight lifting and exercise.  Advised to monitor weight closely for next six months; RTC for ongoing weight loss.    Orders Placed This Encounter  Procedures  . CBC with Differential/Platelet  . Comprehensive metabolic panel    Order Specific Question:   Has the patient fasted?    Answer:   No  . Hemoglobin A1c  . Lipid panel    Order Specific Question:   Has the patient fasted?    Answer:   No  . PSA  . TSH  . POCT urinalysis dipstick  . EKG 12-Lead   Meds ordered this encounter  Medications  . Zoster Vaccine Adjuvanted [facility]) injection    Sig: Inject 0.5 mLs into the muscle once for 1 dose.    Dispense:  0.5 mL    Refill:  1    Return in about 1 year (around 09/15/2018) for complete physical examiniation.   Elijah Phommachanh Elayne Guerin, M.D. Primary Care at [facility] previously [facility] [place] [address] [place], [place]  [address] [phone] phone [phone] fax

## 2017-09-15 NOTE — Patient Instructions (Addendum)
   IF you received an x-ray today, you will receive an invoice from [facility]. Please contact [facility] at [phone] with questions or concerns regarding your invoice.   IF you received labwork today, you will receive an invoice from [facility]. Please contact [facility] at [phone] with questions or concerns regarding your invoice.   Our billing staff will not be able to assist you with questions regarding bills from these companies.  You will be contacted with the lab results as soon as they are available. The fastest way to get your results is to activate your My Chart account. Instructions are located on the last page of this paperwork. If you have not heard from us regarding the results in 2 weeks, please contact this office.     Preventive Care 40-64 Years, Male Preventive care refers to lifestyle choices and visits with your health care provider that can promote health and wellness. What does preventive care include?  A yearly physical exam. This is also called an annual well check.  Dental exams once or twice a year.  Routine eye exams. Ask your health care provider how often you should have your eyes checked.  Personal lifestyle choices, including: ? Daily care of your teeth and gums. ? Regular physical activity. ? Eating a healthy diet. ? Avoiding tobacco and drug use. ? Limiting alcohol use. ? Practicing safe sex. ? Taking low-dose aspirin every day starting at age 50. What happens during an annual well check? The services and screenings done by your health care provider during your annual well check will depend on your age, overall health, lifestyle risk factors, and family history of disease. Counseling Your health care provider may ask you questions about your:  Alcohol use.  Tobacco use.  Drug use.  Emotional well-being.  Home and relationship well-being.  Sexual activity.  Eating habits.  Work and work  environment.  Screening You may have the following tests or measurements:  Height, weight, and BMI.  Blood pressure.  Lipid and cholesterol levels. These may be checked every 5 years, or more frequently if you are over 50 years old.  Skin check.  Lung cancer screening. You may have this screening every year starting at age 55 if you have a 30-pack-year history of smoking and currently smoke or have quit within the past 15 years.  Fecal occult blood test (FOBT) of the stool. You may have this test every year starting at age 50.  Flexible sigmoidoscopy or colonoscopy. You may have a sigmoidoscopy every 5 years or a colonoscopy every 10 years starting at age 50.  Prostate cancer screening. Recommendations will vary depending on your family history and other risks.  Hepatitis C blood test.  Hepatitis B blood test.  Sexually transmitted disease (STD) testing.  Diabetes screening. This is done by checking your blood sugar (glucose) after you have not eaten for a while (fasting). You may have this done every 1-3 years.  Discuss your test results, treatment options, and if necessary, the need for more tests with your health care provider. Vaccines Your health care provider may recommend certain vaccines, such as:  Influenza vaccine. This is recommended every year.  Tetanus, diphtheria, and acellular pertussis (Tdap, Td) vaccine. You may need a Td booster every 10 years.  Varicella vaccine. You may need this if you have not been vaccinated.  Zoster vaccine. You may need this after age 60.  Measles, mumps, and rubella (MMR) vaccine. You may need at least one dose of   dose of MMR if you were born in 1957 or later. You may also need a second dose.  Pneumococcal 13-valent conjugate (PCV13) vaccine. You may need this if you have certain conditions and have not been vaccinated.  Pneumococcal polysaccharide (PPSV23) vaccine. You may need one or two doses if you smoke cigarettes or if you have  certain conditions.  Meningococcal vaccine. You may need this if you have certain conditions.  Hepatitis A vaccine. You may need this if you have certain conditions or if you travel or work in places where you may be exposed to hepatitis A.  Hepatitis B vaccine. You may need this if you have certain conditions or if you travel or work in places where you may be exposed to hepatitis B.  Haemophilus influenzae type b (Hib) vaccine. You may need this if you have certain risk factors.  Talk to your health care provider about which screenings and vaccines you need and how often you need them. This information is not intended to replace advice given to you by your health care provider. Make sure you discuss any questions you have with your health care provider. Document Released: 08/02/2015 Document Revised: 03/25/2016 Document Reviewed: 05/07/2015 Elsevier Interactive Patient Education  [employer].

## 2017-09-16 LAB — CBC WITH DIFFERENTIAL/PLATELET
Basophils Absolute: 0 x10E3/uL (ref 0.0–0.2)
Basos: 1 %
EOS (ABSOLUTE): 0.2 x10E3/uL (ref 0.0–0.4)
Eos: 3 %
Hematocrit: 45.3 % (ref 37.5–51.0)
Hemoglobin: 15.5 g/dL (ref 13.0–17.7)
Immature Grans (Abs): 0 x10E3/uL (ref 0.0–0.1)
Immature Granulocytes: 0 %
Lymphocytes Absolute: 1.5 x10E3/uL (ref 0.7–3.1)
Lymphs: 19 %
MCH: 29.9 pg (ref 26.6–33.0)
MCHC: 34.2 g/dL (ref 31.5–35.7)
MCV: 88 fL (ref 79–97)
Monocytes Absolute: 0.8 x10E3/uL (ref 0.1–0.9)
Monocytes: 10 %
Neutrophils Absolute: 5.3 x10E3/uL (ref 1.4–7.0)
Neutrophils: 67 %
Platelets: 288 x10E3/uL (ref 150–379)
RBC: 5.18 x10E6/uL (ref 4.14–5.80)
RDW: 13.2 % (ref 12.3–15.4)
WBC: 7.8 x10E3/uL (ref 3.4–10.8)

## 2017-09-16 LAB — COMPREHENSIVE METABOLIC PANEL WITH GFR
ALT: 15 IU/L (ref 0–44)
AST: 21 IU/L (ref 0–40)
Albumin/Globulin Ratio: 2.3 — ABNORMAL HIGH (ref 1.2–2.2)
Albumin: 4.5 g/dL (ref 3.6–4.8)
Alkaline Phosphatase: 126 IU/L — ABNORMAL HIGH (ref 39–117)
BUN/Creatinine Ratio: 10 (ref 10–24)
BUN: 12 mg/dL (ref 8–27)
Bilirubin Total: 0.5 mg/dL (ref 0.0–1.2)
CO2: 24 mmol/L (ref 20–29)
Calcium: 9.7 mg/dL (ref 8.6–10.2)
Chloride: 100 mmol/L (ref 96–106)
Creatinine, Ser: 1.17 mg/dL (ref 0.76–1.27)
GFR calc Af Amer: 76 mL/min/1.73
GFR calc non Af Amer: 66 mL/min/1.73
Globulin, Total: 2 g/dL (ref 1.5–4.5)
Glucose: 72 mg/dL (ref 65–99)
Potassium: 4.1 mmol/L (ref 3.5–5.2)
Sodium: 143 mmol/L (ref 134–144)
Total Protein: 6.5 g/dL (ref 6.0–8.5)

## 2017-09-16 LAB — PSA: Prostate Specific Ag, Serum: 1.8 ng/mL (ref 0.0–4.0)

## 2017-09-16 LAB — LIPID PANEL
Chol/HDL Ratio: 3.2 ratio (ref 0.0–5.0)
Cholesterol, Total: 159 mg/dL (ref 100–199)
HDL: 49 mg/dL
LDL Calculated: 92 mg/dL (ref 0–99)
Triglycerides: 91 mg/dL (ref 0–149)
VLDL Cholesterol Cal: 18 mg/dL (ref 5–40)

## 2017-09-16 LAB — TSH: TSH: 3.94 u[IU]/mL (ref 0.450–4.500)

## 2017-09-16 LAB — HEMOGLOBIN A1C
Est. average glucose Bld gHb Est-mCnc: 103 mg/dL
Hgb A1c MFr Bld: 5.2 % (ref 4.8–5.6)

## 2017-12-14 ENCOUNTER — Encounter: Payer: Self-pay | Admitting: Family Medicine

## 2018-04-19 ENCOUNTER — Emergency Department (HOSPITAL_COMMUNITY)
Admission: EM | Admit: 2018-04-19 | Discharge: 2018-04-19 | Disposition: A | Discharge status: Home / Self Care | Payer: BLUE CROSS/BLUE SHIELD | Attending: Emergency Medicine | Admitting: Emergency Medicine

## 2018-04-19 ENCOUNTER — Other Ambulatory Visit: Payer: Self-pay

## 2018-04-19 ENCOUNTER — Encounter (HOSPITAL_COMMUNITY): Payer: Self-pay | Admitting: Emergency Medicine

## 2018-04-19 ENCOUNTER — Emergency Department (HOSPITAL_COMMUNITY): Payer: BLUE CROSS/BLUE SHIELD

## 2018-04-19 DIAGNOSIS — Y9389 Activity, other specified: Secondary | ICD-10-CM | POA: Insufficient documentation

## 2018-04-19 DIAGNOSIS — Y999 Unspecified external cause status: Secondary | ICD-10-CM | POA: Insufficient documentation

## 2018-04-19 DIAGNOSIS — Z79899 Other long term (current) drug therapy: Secondary | ICD-10-CM | POA: Insufficient documentation

## 2018-04-19 DIAGNOSIS — Z7982 Long term (current) use of aspirin: Secondary | ICD-10-CM | POA: Insufficient documentation

## 2018-04-19 DIAGNOSIS — M546 Pain in thoracic spine: Secondary | ICD-10-CM | POA: Diagnosis not present

## 2018-04-19 DIAGNOSIS — S161XXA Strain of muscle, fascia and tendon at neck level, initial encounter: Secondary | ICD-10-CM | POA: Insufficient documentation

## 2018-04-19 DIAGNOSIS — S199XXA Unspecified injury of neck, initial encounter: Secondary | ICD-10-CM | POA: Diagnosis present

## 2018-04-19 DIAGNOSIS — Y92410 Unspecified street and highway as the place of occurrence of the external cause: Secondary | ICD-10-CM | POA: Insufficient documentation

## 2018-04-19 DIAGNOSIS — R51 Headache: Secondary | ICD-10-CM | POA: Diagnosis not present

## 2018-04-19 MED ORDER — LIDOCAINE 5 % EX PTCH
1.0000 | MEDICATED_PATCH | CUTANEOUS | Status: DC
  Administered 2018-04-19: 1 via TRANSDERMAL
  Filled 2018-04-19: qty 1

## 2018-04-19 MED ORDER — LIDOCAINE 5 % EX PTCH: 1.0000 | MEDICATED_PATCH | CUTANEOUS | 0 refills | Status: AC

## 2018-04-19 MED ORDER — ACETAMINOPHEN 325 MG PO TABS
650.0000 mg | ORAL_TABLET | Freq: Once | ORAL | Status: AC
  Administered 2018-04-19: 325 mg via ORAL
  Filled 2018-04-19: qty 2

## 2018-04-19 NOTE — ED Notes (Signed)
Bed: WTR8 Expected date:  Expected time:  Means of arrival:  Comments: 
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
## 2018-04-19 NOTE — Discharge Instructions (Addendum)
Apply warm compresses to sore muscles for 20 minutes at a time.  Motrin or Aspirin for your pain as directed. Lidoderm patches as directed for pain. Gentle stretching.  Follow up with your doctor, return to ER for worsening or concerning symptoms.
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
## 2018-07-08 ENCOUNTER — Ambulatory Visit: Payer: BLUE CROSS/BLUE SHIELD | Admitting: Emergency Medicine

## 2018-07-08 ENCOUNTER — Other Ambulatory Visit: Payer: Self-pay

## 2018-07-08 ENCOUNTER — Encounter: Payer: Self-pay | Admitting: Emergency Medicine

## 2018-07-08 VITALS — BP 100/64 | HR 80 | Temp 98.7°F | Resp 16 | Ht 73.0 in | Wt 163.9 lb

## 2018-07-08 DIAGNOSIS — R0989 Other specified symptoms and signs involving the circulatory and respiratory systems: Secondary | ICD-10-CM | POA: Insufficient documentation

## 2018-07-08 DIAGNOSIS — R059 Cough, unspecified: Secondary | ICD-10-CM | POA: Insufficient documentation

## 2018-07-08 DIAGNOSIS — R05 Cough: Secondary | ICD-10-CM | POA: Diagnosis not present

## 2018-07-08 DIAGNOSIS — J22 Unspecified acute lower respiratory infection: Secondary | ICD-10-CM | POA: Diagnosis not present

## 2018-07-08 LAB — POCT INFLUENZA A/B
INFLUENZA A, POC: NEGATIVE
INFLUENZA B, POC: NEGATIVE

## 2018-07-08 MED ORDER — AZITHROMYCIN 250 MG PO TABS: ORAL_TABLET | ORAL | 0 refills | Status: DC

## 2018-07-08 MED ORDER — PROMETHAZINE-CODEINE 6.25-10 MG/5ML PO SYRP: 5.0000 mL | ORAL_SOLUTION | Freq: Every evening | ORAL | 0 refills | Status: DC | PRN

## 2018-07-08 MED ORDER — PSEUDOEPHEDRINE-GUAIFENESIN ER 60-600 MG PO TB12: 1.0000 | ORAL_TABLET | Freq: Two times a day (BID) | ORAL | 1 refills | Status: AC

## 2018-07-08 MED ORDER — BENZONATATE 200 MG PO CAPS: 200.0000 mg | ORAL_CAPSULE | Freq: Two times a day (BID) | ORAL | 0 refills | Status: DC | PRN

## 2018-07-08 NOTE — Patient Instructions (Addendum)
     If you have lab work done today you will be contacted with your lab results within the next 2 weeks.  If you have not heard from us then please contact us. The fastest way to get your results is to register for My Chart.   IF you received an x-ray today, you will receive an invoice from [facility]. Please contact [facility] at [phone] with questions or concerns regarding your invoice.   IF you received labwork today, you will receive an invoice from [facility]. Please contact [facility] at [phone] with questions or concerns regarding your invoice.   Our billing staff will not be able to assist you with questions regarding bills from these companies.  You will be contacted with the lab results as soon as they are available. The fastest way to get your results is to activate your My Chart account. Instructions are located on the last page of this paperwork. If you have not heard from us regarding the results in 2 weeks, please contact this office.     Cough, Adult  A cough helps to clear your throat and lungs. A cough may last only 2-3 weeks (acute), or it may last longer than 8 weeks (chronic). Many different things can cause a cough. A cough may be a sign of an illness or another medical condition. Follow these instructions at home:  Pay attention to any changes in your cough.  Take medicines only as told by your doctor. ? If you were prescribed an antibiotic medicine, take it as told by your doctor. Do not stop taking it even if you start to feel better. ? Talk with your doctor before you try using a cough medicine.  Drink enough fluid to keep your pee (urine) clear or pale yellow.  If the air is dry, use a cold steam vaporizer or humidifier in your home.  Stay away from things that make you cough at work or at home.  If your cough is worse at night, try using extra pillows to raise your head up higher while you sleep.  Do not smoke, and try not to  be around smoke. If you need help quitting, ask your doctor.  Do not have caffeine.  Do not drink alcohol.  Rest as needed. Contact a doctor if:  You have new problems (symptoms).  You cough up yellow fluid (pus).  Your cough does not get better after 2-3 weeks, or your cough gets worse.  Medicine does not help your cough and you are not sleeping well.  You have pain that gets worse or pain that is not helped with medicine.  You have a fever.  You are losing weight and you do not know why.  You have night sweats. Get help right away if:  You cough up blood.  You have trouble breathing.  Your heartbeat is very fast. This information is not intended to replace advice given to you by your health care provider. Make sure you discuss any questions you have with your health care provider. Document Released: 03/19/2011 Document Revised: 12/12/2015 Document Reviewed: 09/12/2014 Elsevier Interactive Patient Education  2019 Elsevier Inc.  

## 2018-07-08 NOTE — Progress Notes (Signed)
Joseph Saunders 62 y.o.   Chief Complaint  Patient presents with  . Cough    x 3 days with brown mucuc and fever  . sorethroat    HISTORY OF PRESENT ILLNESS: This is a 62 y.o. male complaining of sore throat, congestion and productive cough with fever for 3 days.  Wife had similar episode 1 week ago.  No other significant symptoms.  HPI   Prior to Admission medications   Medication Sig Start Date End Date Taking? Authorizing Provider  fish oil-omega-3 fatty acids 1000 MG capsule Take 2 g by mouth daily.   Yes [provider]  FLUZONE QUADRIVALENT 0.5 ML injection ADM 0.5ML IM UTD 06/17/17  Yes [provider]  Magnesium Carbonate (MAGNESIUM GLUCONATE) 54mg /4ml syringe Take by mouth.   Yes [provider]  Multiple Vitamin (MULTI-VITAMINS) TABS Take by mouth.   Yes [provider]  [facility] Wort 1000 MG CAPS Take by mouth.   Yes [provider]  zoster vaccine live, PF, (ZOSTAVAX) 46962 UNT/0.65ML injection Inject 19,400 Units into the skin once. 07/29/15  Yes Wardell Honour, MD  aspirin 81 MG tablet Take 81 mg by mouth daily.    [provider]  lidocaine (LIDODERM) 5 % Place 1 patch onto the skin daily. Remove & Discard patch within 12 hours or as directed by MD Patient not taking: Reported on 07/08/2018 04/19/18   Suella Broad A, PA-C    Allergies  Allergen Reactions  . Sulfa Antibiotics Nausea And Vomiting    Patient Active Problem List   Diagnosis Date Noted  . Pain in joint, shoulder region 10/22/2014    Past Medical History:  Diagnosis Date  . 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 On fluoxetine.  . Benign positional vertigo   . Cataract   . History of chicken pox   . Mitral valve prolapse     Past Surgical History:  Procedure Laterality Date  . TONSILECTOMY, ADENOIDECTOMY, BILATERAL MYRINGOTOMY AND TUBES      Social History   Socioeconomic History  . Marital status:  Married    Spouse name: Not on file  . Number of children: 4  . Years of education: Not on file  . Highest education level: Not on file  Occupational History  . 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 Financial resource strain: Not on file  . Food insecurity:    Worry: Not on file    Inability: Not on file  . Transportation needs:    Medical: Not on file    Non-medical: Not on file  Tobacco Use  . Smoking status: Never Smoker  . Smokeless tobacco: Never Used  Substance and Sexual Activity  . Alcohol use: Yes    Alcohol/week: 2.0 standard drinks    Types: 1 Glasses of wine, 1 Cans of beer per week    Comment: 1 drink daily in the past  . Drug use: No  . Sexual activity: Yes    Partners: Female    Comment: married  Lifestyle  . Physical activity:    Days per week: Not on file    Minutes per session: Not on file  . Stress: Not on file  Relationships  . Social connections:    Talks on phone: Not on file    Gets together: Not on file    Attends religious service: Not on file    Active member of  club or organization: Not on file    Attends meetings of clubs or organizations: Not on file    Relationship status: Not on file  . Intimate partner violence:    Fear of current or ex partner: Not on file    Emotionally abused: Not on file    Physically abused: Not on file    Forced sexual activity: Not on file  Other Topics Concern  . Not on file  Social History Narrative   Marital status:  Married x 31 years      Children: 4 (3 biological, 1 adopted).  No grandchildren; one on the way in 2017.      Lives: with wife, 3 children.      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      Tobacco: none      Alcohol: one drink per night with dinner.  3-4 drinks per week.  Beer and wine.      Drugs:  None      Exercise:  Walking. Weight lifting 3 days per week.       Diet: healthy      Seatbelts: 100%      Guns: none      Education:  College/Other.   Religion: [place], good support     Family History  Problem Relation Age of Onset  . Heart disease Father 9       CHF; smoking/tobacco  . Diverticulitis Brother   . Mental illness Brother   . Heart disease Paternal Grandfather   . Abnormal EKG Paternal Grandfather   . Osteoporosis Mother   . Mental illness Brother   . Cancer Maternal Grandmother   . Hypertension Paternal Grandmother   . Lung cancer Maternal Grandfather      Review of Systems  Constitutional: Positive for chills and fever.  HENT: Positive for congestion and sore throat.   Eyes: Negative.   Respiratory: Positive for cough and sputum production. Negative for wheezing.   Cardiovascular: Negative.  Negative for chest pain and palpitations.  Gastrointestinal: Negative.  Negative for abdominal pain, diarrhea, nausea and vomiting.  Genitourinary: Negative.  Negative for dysuria and hematuria.  Musculoskeletal: Negative.  Negative for back pain and myalgias.  Skin: Negative.  Negative for rash.  Neurological: Negative.  Negative for dizziness and headaches.  Endo/Heme/Allergies: Negative.   All other systems reviewed and are negative.  Vitals:   07/08/18 1457 07/08/18 1528  BP: (!) 81/47 100/64  Pulse: 97 80  Resp: 16   Temp: 98.7 F (37.1 C)   SpO2: 97%      Physical Exam Vitals signs reviewed.  Constitutional:      Appearance: Normal appearance.  HENT:     Head: Normocephalic and atraumatic.     Nose: Nose normal.     Mouth/Throat:     Mouth: Mucous membranes are moist.     Pharynx: Oropharynx is clear.  Eyes:     Extraocular Movements: Extraocular movements intact.     Conjunctiva/sclera: Conjunctivae normal.     Pupils: Pupils are equal, round, and reactive to light.  Neck:     Musculoskeletal: Normal range of motion and neck supple.  Cardiovascular:     Rate and Rhythm: Normal rate and regular rhythm.     Heart sounds: Normal heart sounds.  Pulmonary:      Effort: Pulmonary effort is normal.     Breath sounds: Normal breath sounds. No wheezing, rhonchi or rales.  Abdominal:     General:  Abdomen is flat.     Palpations: Abdomen is soft.  Musculoskeletal: Normal range of motion.  Skin:    General: Skin is warm and dry.     Capillary Refill: Capillary refill takes less than 2 seconds.  Neurological:     General: No focal deficit present.     Mental Status: He is alert and oriented to person, place, and time.  Psychiatric:        Mood and Affect: Mood normal.        Behavior: Behavior normal.      Results for orders placed or performed in visit on 07/08/18 (from the past 24 hour(s))  POCT Influenza A/B     Status: None   Collection Time: 07/08/18  3:20 PM  Result Value Ref Range   Influenza A, POC Negative Negative   Influenza B, POC Negative Negative   A total of 25 minutes was spent in the room with the patient, greater than 50% of which was in counseling/coordination of care regarding differential diagnosis, treatment, medications, prognosis, and need for follow-up if no better or worse.   ASSESSMENT & PLAN: Etienne was seen today for cough and sorethroat.  Diagnoses and all orders for this visit:  Cough -     POCT Influenza A/B -     benzonatate (TESSALON) 200 MG capsule; Take 1 capsule (200 mg total) by mouth 2 (two) times daily as needed for cough. -     promethazine-codeine (PHENERGAN WITH CODEINE) 6.25-10 MG/5ML syrup; Take 5 mLs by mouth at bedtime as needed for cough.  Lower respiratory infection -     azithromycin (ZITHROMAX) 250 MG tablet; Sig as indicated  Chest congestion -     pseudoephedrine-guaifenesin (MUCINEX D) 60-600 MG 12 hr tablet; Take 1 tablet by mouth every 12 (twelve) hours for 5 days.    Patient Instructions       If you have lab work done today you will be contacted with your lab results within the next 2 weeks.  If you have not heard from [place] then please contact us. The fastest way to get  your results is to register for My Chart.   IF you received an x-ray today, you will receive an invoice from [facility]. Please contact [facility] at [phone] with questions or concerns regarding your invoice.   IF you received labwork today, you will receive an invoice from [facility]. Please contact [facility] at [phone] with questions or concerns regarding your invoice.   Our billing staff will not be able to assist you with questions regarding bills from these companies.  You will be contacted with the lab results as soon as they are available. The fastest way to get your results is to activate your My Chart account. Instructions are located on the last page of this paperwork. If you have not heard from [place] regarding the results in 2 weeks, please contact this office.     Cough, Adult  A cough helps to clear your throat and lungs. A cough may last only 2-3 weeks (acute), or it may last longer than 8 weeks (chronic). Many different things can cause a cough. A cough may be a sign of an illness or another medical condition. Follow these instructions at home:  Pay attention to any changes in your cough.  Take medicines only as told by your doctor. ? If you were prescribed an antibiotic medicine, take it as told by your doctor. Do not stop taking it even if you  start to feel better. ? Talk with your doctor before you try using a cough medicine.  Drink enough fluid to keep your pee (urine) clear or pale yellow.  If the air is dry, use a cold steam vaporizer or humidifier in your home.  Stay away from things that make you cough at work or at home.  If your cough is worse at night, try using extra pillows to raise your head up higher while you sleep.  Do not smoke, and try not to be around smoke. If you need help quitting, ask your doctor.  Do not have caffeine.  Do not drink alcohol.  Rest as needed. Contact a doctor if:  You have new problems  (symptoms).  You cough up yellow fluid (pus).  Your cough does not get better after 2-3 weeks, or your cough gets worse.  Medicine does not help your cough and you are not sleeping well.  You have pain that gets worse or pain that is not helped with medicine.  You have a fever.  You are losing weight and you do not know why.  You have night sweats. Get help right away if:  You cough up blood.  You have trouble breathing.  Your heartbeat is very fast. This information is not intended to replace advice given to you by your health care provider. Make sure you discuss any questions you have with your health care provider. Document Released: 03/19/2011 Document Revised: 12/12/2015 Document Reviewed: 09/12/2014 Elsevier Interactive Patient Education  2019 Elsevier Inc.      Agustina Caroli, MD [facility]

## 2018-10-05 ENCOUNTER — Encounter (INDEPENDENT_AMBULATORY_CARE_PROVIDER_SITE_OTHER): Payer: BLUE CROSS/BLUE SHIELD | Admitting: Ophthalmology

## 2019-01-24 ENCOUNTER — Encounter: Payer: Self-pay | Admitting: Emergency Medicine

## 2019-01-24 ENCOUNTER — Other Ambulatory Visit: Payer: Self-pay

## 2019-01-24 ENCOUNTER — Ambulatory Visit (INDEPENDENT_AMBULATORY_CARE_PROVIDER_SITE_OTHER): Payer: Medicare Other | Admitting: Emergency Medicine

## 2019-01-24 VITALS — BP 112/67 | HR 65 | Temp 98.5°F | Resp 16 | Ht 73.0 in | Wt 166.0 lb

## 2019-01-24 DIAGNOSIS — Z13220 Encounter for screening for lipoid disorders: Secondary | ICD-10-CM

## 2019-01-24 DIAGNOSIS — Z125 Encounter for screening for malignant neoplasm of prostate: Secondary | ICD-10-CM

## 2019-01-24 DIAGNOSIS — Z23 Encounter for immunization: Secondary | ICD-10-CM

## 2019-01-24 DIAGNOSIS — Z130 Encounter for screening for diseases of the blood and blood-forming organs and certain disorders involving the immune mechanism: Secondary | ICD-10-CM | POA: Diagnosis not present

## 2019-01-24 DIAGNOSIS — Z1329 Encounter for screening for other suspected endocrine disorder: Secondary | ICD-10-CM | POA: Diagnosis not present

## 2019-01-24 DIAGNOSIS — Z0000 Encounter for general adult medical examination without abnormal findings: Secondary | ICD-10-CM

## 2019-01-24 DIAGNOSIS — Z13228 Encounter for screening for other metabolic disorders: Secondary | ICD-10-CM

## 2019-01-24 NOTE — Patient Instructions (Addendum)
   If you have lab work done today you will be contacted with your lab results within the next 2 weeks.  If you have not heard from us then please contact us. The fastest way to get your results is to register for My Chart.   IF you received an x-ray today, you will receive an invoice from [facility]. Please contact [facility] at [phone] with questions or concerns regarding your invoice.   IF you received labwork today, you will receive an invoice from [facility]. Please contact [facility] at [phone] with questions or concerns regarding your invoice.   Our billing staff will not be able to assist you with questions regarding bills from these companies.  You will be contacted with the lab results as soon as they are available. The fastest way to get your results is to activate your My Chart account. Instructions are located on the last page of this paperwork. If you have not heard from us regarding the results in 2 weeks, please contact this office.     Health Maintenance, Male Adopting a healthy lifestyle and getting preventive care are important in promoting health and wellness. Ask your health care provider about:  The right schedule for you to have regular tests and exams.  Things you can do on your own to prevent diseases and keep yourself healthy. What should I know about diet, weight, and exercise? Eat a healthy diet   Eat a diet that includes plenty of vegetables, fruits, low-fat dairy products, and lean protein.  Do not eat a lot of foods that are high in solid fats, added sugars, or sodium. Maintain a healthy weight Body mass index (BMI) is a measurement that can be used to identify possible weight problems. It estimates body fat based on height and weight. Your health care provider can help determine your BMI and help you achieve or maintain a healthy weight. Get regular exercise Get regular exercise. This is one of the most important things you  can do for your health. Most adults should:  Exercise for at least 150 minutes each week. The exercise should increase your heart rate and make you sweat (moderate-intensity exercise).  Do strengthening exercises at least twice a week. This is in addition to the moderate-intensity exercise.  Spend less time sitting. Even light physical activity can be beneficial. Watch cholesterol and blood lipids Have your blood tested for lipids and cholesterol at 65 years of age, then have this test every 5 years. You may need to have your cholesterol levels checked more often if:  Your lipid or cholesterol levels are high.  You are older than 65 years of age.  You are at high risk for heart disease. What should I know about cancer screening? Many types of cancers can be detected early and may often be prevented. Depending on your health history and family history, you may need to have cancer screening at various ages. This may include screening for:  Colorectal cancer.  Prostate cancer.  Skin cancer.  Lung cancer. What should I know about heart disease, diabetes, and high blood pressure? Blood pressure and heart disease  High blood pressure causes heart disease and increases the risk of stroke. This is more likely to develop in people who have high blood pressure readings, are of African descent, or are overweight.  Talk with your health care provider about your target blood pressure readings.  Have your blood pressure checked: ? Every 3-5 years if you are 18-39 years   of age. ? Every year if you are 40 years old or older.  If you are between the ages of 65 and 75 and are a current or former smoker, ask your health care provider if you should have a one-time screening for abdominal aortic aneurysm (AAA). Diabetes Have regular diabetes screenings. This checks your fasting blood sugar level. Have the screening done:  Once every three years after age 45 if you are at a normal weight and have  a low risk for diabetes.  More often and at a younger age if you are overweight or have a high risk for diabetes. What should I know about preventing infection? Hepatitis B If you have a higher risk for hepatitis B, you should be screened for this virus. Talk with your health care provider to find out if you are at risk for hepatitis B infection. Hepatitis C Blood testing is recommended for:  Everyone born from 1945 through 1965.  Anyone with known risk factors for hepatitis C. Sexually transmitted infections (STIs)  You should be screened each year for STIs, including gonorrhea and chlamydia, if: ? You are sexually active and are younger than 65 years of age. ? You are older than 65 years of age and your health care provider tells you that you are at risk for this type of infection. ? Your sexual activity has changed since you were last screened, and you are at increased risk for chlamydia or gonorrhea. Ask your health care provider if you are at risk.  Ask your health care provider about whether you are at high risk for HIV. Your health care provider may recommend a prescription medicine to help prevent HIV infection. If you choose to take medicine to prevent HIV, you should first get tested for HIV. You should then be tested every 3 months for as long as you are taking the medicine. Follow these instructions at home: Lifestyle  Do not use any products that contain nicotine or tobacco, such as cigarettes, e-cigarettes, and chewing tobacco. If you need help quitting, ask your health care provider.  Do not use street drugs.  Do not share needles.  Ask your health care provider for help if you need support or information about quitting drugs. Alcohol use  Do not drink alcohol if your health care provider tells you not to drink.  If you drink alcohol: ? Limit how much you have to 0-2 drinks a day. ? Be aware of how much alcohol is in your drink. In the [place] one drink equals one 12  oz bottle of beer (355 mL), one 5 oz glass of wine (148 mL), or one 1 oz glass of hard liquor (44 mL). General instructions  Schedule regular health, dental, and eye exams.  Stay current with your vaccines.  Tell your health care provider if: ? You often feel depressed. ? You have ever been abused or do not feel safe at home. Summary  Adopting a healthy lifestyle and getting preventive care are important in promoting health and wellness.  Follow your health care provider's instructions about healthy diet, exercising, and getting tested or screened for diseases.  Follow your health care provider's instructions on monitoring your cholesterol and blood pressure. This information is not intended to replace advice given to you by your health care provider. Make sure you discuss any questions you have with your health care provider. Document Released: 01/02/2008 Document Revised: 06/29/2018 Document Reviewed: 06/29/2018 Elsevier Patient Education  2020 Elsevier Inc.  

## 2019-01-24 NOTE — Progress Notes (Signed)
Joseph Saunders 63 y.o.   Chief Complaint  Patient presents with  . Annual Exam    HISTORY OF PRESENT ILLNESS: This is a 63 y.o. male here for his annual exam.  Has no complaints or medical concerns. No chronic medical problems.  No chronic medications.  Healthy lifestyle. Non-smoker.  Occasional drinker.  Exercises regularly. Health maintenance reviewed.  HPI   Prior to Admission medications   Medication Sig Start Date End Date Taking? Authorizing Provider  aspirin 81 MG tablet Take 81 mg by mouth daily.   Yes [provider]  fish oil-omega-3 fatty acids 1000 MG capsule Take 2 g by mouth daily.   Yes [provider]  Magnesium 400 MG TABS Take by mouth daily.   Yes [provider]  Multiple Vitamin (MULTI-VITAMINS) TABS Take by mouth.   Yes [provider]  [facility] Wort 1000 MG CAPS Take by mouth.   Yes [provider]  vitamin E (VITAMIN E) 400 UNIT capsule Take 400 Units by mouth daily.   Yes [provider]  FLUZONE QUADRIVALENT 0.5 ML injection ADM 0.5ML IM UTD 06/17/17   [provider]  lidocaine (LIDODERM) 5 % Place 1 patch onto the skin daily. Remove & Discard patch within 12 hours or as directed by MD Patient not taking: Reported on 01/24/2019 04/19/18   Suella Broad A, PA-C  Magnesium Carbonate (MAGNESIUM GLUCONATE) 54mg /44ml syringe Take by mouth.    [provider]  zoster vaccine live, PF, (ZOSTAVAX) 44967 UNT/0.65ML injection Inject 19,400 Units into the skin once. 07/29/15   Wardell Honour, MD    Allergies  Allergen Reactions  . Sulfa Antibiotics Nausea And Vomiting    There are no active problems to display for this patient.   Past Medical History:  Diagnosis Date  . 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 On fluoxetine.  . Benign positional vertigo   . Cataract   . History of chicken pox   . Mitral valve prolapse     Past Surgical History:  Procedure  Laterality Date  . TONSILECTOMY, ADENOIDECTOMY, BILATERAL MYRINGOTOMY AND TUBES      Social History   Socioeconomic History  . Marital status: Married    Spouse name: Not on file  . Number of children: 4  . Years of education: Not on file  . Highest education level: Not on file  Occupational History  . 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 Financial resource strain: Not on file  . Food insecurity    Worry: Not on file    Inability: Not on file  . Transportation needs    Medical: Not on file    Non-medical: Not on file  Tobacco Use  . Smoking status: Never Smoker  . Smokeless tobacco: Never Used  Substance and Sexual Activity  . Alcohol use: Yes    Alcohol/week: 2.0 standard drinks    Types: 1 Glasses of wine, 1 Cans of beer per week    Comment: 1 drink daily in the past  . Drug use: No  . Sexual activity: Yes    Partners: Female    Comment: married  Lifestyle  . Physical activity    Days per week: Not on file    Minutes per session: Not on file  . Stress: Not on file  Relationships  . Social connections    Talks on phone: Not on file  Gets together: Not on file    Attends religious service: Not on file    Active member of club or organization: Not on file    Attends meetings of clubs or organizations: Not on file    Relationship status: Not on file  . Intimate partner violence    Fear of current or ex partner: Not on file    Emotionally abused: Not on file    Physically abused: Not on file    Forced sexual activity: Not on file  Other Topics Concern  . Not on file  Social History Narrative   Marital status:  Married x 31 years      Children: 4 (3 biological, 1 adopted).  No grandchildren; one on the way in 2017.      Lives: with wife, 3 children.      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      Tobacco: none      Alcohol: one drink per night with dinner.  3-4 drinks per week.  Beer and wine.       Drugs:  None      Exercise:  Walking. Weight lifting 3 days per week.       Diet: healthy      Seatbelts: 100%      Guns: none      Education: College/Other.   Religion: [place], good support     Family History  Problem Relation Age of Onset  . Heart disease Father 25       CHF; smoking/tobacco  . Diverticulitis Brother   . Mental illness Brother   . Heart disease Paternal Grandfather   . Abnormal EKG Paternal Grandfather   . Osteoporosis Mother   . Mental illness Brother   . Cancer Maternal Grandmother   . Hypertension Paternal Grandmother   . Lung cancer Maternal Grandfather      Review of Systems  Constitutional: Negative.  Negative for chills and fever.  HENT: Negative.  Negative for congestion, sinus pain and sore throat.   Eyes: Negative.   Respiratory: Negative.  Negative for cough and shortness of breath.   Cardiovascular: Negative.  Negative for chest pain and palpitations.  Gastrointestinal: Negative.  Negative for abdominal pain, blood in stool, diarrhea, nausea and vomiting.       Occasional indigestion with occasional left lower abdominal cramping.  Genitourinary: Negative for dysuria, frequency and hematuria.       Urinates twice during the night.  Musculoskeletal: Positive for joint pain.  Skin: Negative.  Negative for rash.  Neurological: Negative.  Negative for dizziness, sensory change, focal weakness and headaches.  Endo/Heme/Allergies: Negative.   All other systems reviewed and are negative.  Vitals:   01/24/19 0854  BP: 112/67  Pulse: 65  Resp: 16  Temp: 98.5 F (36.9 C)  SpO2: 96%     Physical Exam Vitals signs reviewed.  Constitutional:      Appearance: Normal appearance.  HENT:     Head: Normocephalic and atraumatic.     Nose: Nose normal.     Mouth/Throat:     Mouth: Mucous membranes are moist.     Pharynx: Oropharynx is clear.  Eyes:     Extraocular Movements: Extraocular movements intact.     Conjunctiva/sclera:  Conjunctivae normal.     Pupils: Pupils are equal, round, and reactive to light.  Neck:     Musculoskeletal: Normal range of motion and neck supple.     Vascular: No carotid bruit.  Cardiovascular:  Rate and Rhythm: Normal rate and regular rhythm.     Pulses: Normal pulses.     Heart sounds: Normal heart sounds.  Pulmonary:     Effort: Pulmonary effort is normal.     Breath sounds: Normal breath sounds.  Abdominal:     General: Bowel sounds are normal. There is no distension.     Palpations: Abdomen is soft. There is no mass.     Tenderness: There is no abdominal tenderness. There is no guarding or rebound.  Musculoskeletal: Normal range of motion.  Lymphadenopathy:     Cervical: No cervical adenopathy.  Skin:    General: Skin is warm and dry.     Capillary Refill: Capillary refill takes less than 2 seconds.  Neurological:     General: No focal deficit present.     Mental Status: He is alert and oriented to person, place, and time.  Psychiatric:        Mood and Affect: Mood normal.        Behavior: Behavior normal.      ASSESSMENT & PLAN: Joseph Saunders was seen today for annual exam.  Diagnoses and all orders for this visit:  Routine general medical examination at a health care facility  Screening for deficiency anemia -     CBC with Differential/Platelet  Screening for lipoid disorders -     Lipid panel  Screening for endocrine, metabolic and immunity disorder -     Comprehensive metabolic panel -     Lipid panel  Need for prophylactic vaccination against Streptococcus pneumoniae (pneumococcus) -     Pneumococcal conjugate vaccine 13-valent IM  Prostate cancer screening -     PSA    Patient Instructions       If you have lab work done today you will be contacted with your lab results within the next 2 weeks.  If you have not heard from [place] then please contact us. The fastest way to get your results is to register for My Chart.   IF you received an x-ray  today, you will receive an invoice from [facility]. Please contact [facility] at [phone] with questions or concerns regarding your invoice.   IF you received labwork today, you will receive an invoice from [facility]. Please contact [facility] at [phone] with questions or concerns regarding your invoice.   Our billing staff will not be able to assist you with questions regarding bills from these companies.  You will be contacted with the lab results as soon as they are available. The fastest way to get your results is to activate your My Chart account. Instructions are located on the last page of this paperwork. If you have not heard from [place] regarding the results in 2 weeks, please contact this office.      Health Maintenance, Male Adopting a healthy lifestyle and getting preventive care are important in promoting health and wellness. Ask your health care provider about:  The right schedule for you to have regular tests and exams.  Things you can do on your own to prevent diseases and keep yourself healthy. What should I know about diet, weight, and exercise? Eat a healthy diet   Eat a diet that includes plenty of vegetables, fruits, low-fat dairy products, and lean protein.  Do not eat a lot of foods that are high in solid fats, added sugars, or sodium. Maintain a healthy weight Body mass index (BMI) is a measurement that can be used to identify possible weight problems. It  estimates body fat based on height and weight. Your health care provider can help determine your BMI and help you achieve or maintain a healthy weight. Get regular exercise Get regular exercise. This is one of the most important things you can do for your health. Most adults should:  Exercise for at least 150 minutes each week. The exercise should increase your heart rate and make you sweat (moderate-intensity exercise).  Do strengthening exercises at least twice a week. This is in  addition to the moderate-intensity exercise.  Spend less time sitting. Even light physical activity can be beneficial. Watch cholesterol and blood lipids Have your blood tested for lipids and cholesterol at 65 years of age, then have this test every 5 years. You may need to have your cholesterol levels checked more often if:  Your lipid or cholesterol levels are high.  You are older than 65 years of age.  You are at high risk for heart disease. What should I know about cancer screening? Many types of cancers can be detected early and may often be prevented. Depending on your health history and family history, you may need to have cancer screening at various ages. This may include screening for:  Colorectal cancer.  Prostate cancer.  Skin cancer.  Lung cancer. What should I know about heart disease, diabetes, and high blood pressure? Blood pressure and heart disease  High blood pressure causes heart disease and increases the risk of stroke. This is more likely to develop in people who have high blood pressure readings, are of African descent, or are overweight.  Talk with your health care provider about your target blood pressure readings.  Have your blood pressure checked: ? Every 3-5 years if you are 76-64 years of age. ? Every year if you are 10 years old or older.  If you are between the ages of 56 and 31 and are a current or former smoker, ask your health care provider if you should have a one-time screening for abdominal aortic aneurysm (AAA). Diabetes Have regular diabetes screenings. This checks your fasting blood sugar level. Have the screening done:  Once every three years after age 68 if you are at a normal weight and have a low risk for diabetes.  More often and at a younger age if you are overweight or have a high risk for diabetes. What should I know about preventing infection? Hepatitis B If you have a higher risk for hepatitis B, you should be screened for  this virus. Talk with your health care provider to find out if you are at risk for hepatitis B infection. Hepatitis C Blood testing is recommended for:  Everyone born from [birth year] through 1965.  Anyone with known risk factors for hepatitis C. Sexually transmitted infections (STIs)  You should be screened each year for STIs, including gonorrhea and chlamydia, if: ? You are sexually active and are younger than 65 years of age. ? You are older than 65 years of age and your health care provider tells you that you are at risk for this type of infection. ? Your sexual activity has changed since you were last screened, and you are at increased risk for chlamydia or gonorrhea. Ask your health care provider if you are at risk.  Ask your health care provider about whether you are at high risk for HIV. Your health care provider may recommend a prescription medicine to help prevent HIV infection. If you choose to take medicine to prevent HIV, you should first get  tested for HIV. You should then be tested every 3 months for as long as you are taking the medicine. Follow these instructions at home: Lifestyle  Do not use any products that contain nicotine or tobacco, such as cigarettes, e-cigarettes, and chewing tobacco. If you need help quitting, ask your health care provider.  Do not use street drugs.  Do not share needles.  Ask your health care provider for help if you need support or information about quitting drugs. Alcohol use  Do not drink alcohol if your health care provider tells you not to drink.  If you drink alcohol: ? Limit how much you have to 0-2 drinks a day. ? Be aware of how much alcohol is in your drink. In the [place] one drink equals one 12 oz bottle of beer (355 mL), one 5 oz glass of wine (148 mL), or one 1 oz glass of hard liquor (44 mL). General instructions  Schedule regular health, dental, and eye exams.  Stay current with your vaccines.  Tell your health care provider  if: ? You often feel depressed. ? You have ever been abused or do not feel safe at home. Summary  Adopting a healthy lifestyle and getting preventive care are important in promoting health and wellness.  Follow your health care provider's instructions about healthy diet, exercising, and getting tested or screened for diseases.  Follow your health care provider's instructions on monitoring your cholesterol and blood pressure. This information is not intended to replace advice given to you by your health care provider. Make sure you discuss any questions you have with your health care provider. Document Released: 01/02/2008 Document Revised: 06/29/2018 Document Reviewed: 06/29/2018 Elsevier Patient Education  2020 Elsevier Inc.      Agustina Caroli, MD [facility]

## 2019-01-25 ENCOUNTER — Encounter: Payer: Self-pay | Admitting: Emergency Medicine

## 2019-01-25 LAB — CBC WITH DIFFERENTIAL/PLATELET
Basophils Absolute: 0 10*3/uL (ref 0.0–0.2)
Basos: 1 %
EOS (ABSOLUTE): 0.1 10*3/uL (ref 0.0–0.4)
Eos: 2 %
Hematocrit: 41.9 % (ref 37.5–51.0)
Hemoglobin: 14.7 g/dL (ref 13.0–17.7)
Immature Grans (Abs): 0 10*3/uL (ref 0.0–0.1)
Immature Granulocytes: 0 %
Lymphocytes Absolute: 1.2 10*3/uL (ref 0.7–3.1)
Lymphs: 21 %
MCH: 30.9 pg (ref 26.6–33.0)
MCHC: 35.1 g/dL (ref 31.5–35.7)
MCV: 88 fL (ref 79–97)
Monocytes Absolute: 0.5 10*3/uL (ref 0.1–0.9)
Monocytes: 9 %
Neutrophils Absolute: 3.9 10*3/uL (ref 1.4–7.0)
Neutrophils: 67 %
Platelets: 268 10*3/uL (ref 150–450)
RBC: 4.76 x10E6/uL (ref 4.14–5.80)
RDW: 12.1 % (ref 11.6–15.4)
WBC: 5.8 10*3/uL (ref 3.4–10.8)

## 2019-01-25 LAB — COMPREHENSIVE METABOLIC PANEL
ALT: 17 IU/L (ref 0–44)
AST: 21 IU/L (ref 0–40)
Albumin/Globulin Ratio: 2.4 — ABNORMAL HIGH (ref 1.2–2.2)
Albumin: 4.3 g/dL (ref 3.8–4.8)
Alkaline Phosphatase: 105 IU/L (ref 39–117)
BUN/Creatinine Ratio: 13 (ref 10–24)
BUN: 15 mg/dL (ref 8–27)
Bilirubin Total: 0.6 mg/dL (ref 0.0–1.2)
CO2: 25 mmol/L (ref 20–29)
Calcium: 9.5 mg/dL (ref 8.6–10.2)
Chloride: 101 mmol/L (ref 96–106)
Creatinine, Ser: 1.12 mg/dL (ref 0.76–1.27)
GFR calc Af Amer: 79 mL/min/{1.73_m2} (ref >59)
GFR calc non Af Amer: 69 mL/min/{1.73_m2} (ref >59)
Globulin, Total: 1.8 g/dL (ref 1.5–4.5)
Glucose: 91 mg/dL (ref 65–99)
Potassium: 4.4 mmol/L (ref 3.5–5.2)
Sodium: 141 mmol/L (ref 134–144)
Total Protein: 6.1 g/dL (ref 6.0–8.5)

## 2019-01-25 LAB — LIPID PANEL
Chol/HDL Ratio: 3.2 ratio (ref 0.0–5.0)
Cholesterol, Total: 154 mg/dL (ref 100–199)
HDL: 48 mg/dL (ref >39)
LDL Calculated: 94 mg/dL (ref 0–99)
Triglycerides: 59 mg/dL (ref 0–149)
VLDL Cholesterol Cal: 12 mg/dL (ref 5–40)

## 2019-03-24 ENCOUNTER — Other Ambulatory Visit: Payer: Self-pay

## 2019-03-24 DIAGNOSIS — Z20822 Contact with and (suspected) exposure to covid-19: Secondary | ICD-10-CM

## 2019-03-26 LAB — NOVEL CORONAVIRUS, NAA: SARS-CoV-2, NAA: NOT DETECTED

## 2019-04-17 LAB — HM COLONOSCOPY

## 2019-06-07 ENCOUNTER — Encounter: Payer: Self-pay | Admitting: *Deleted

## 2019-06-28 DIAGNOSIS — M9903 Segmental and somatic dysfunction of lumbar region: Secondary | ICD-10-CM | POA: Diagnosis not present

## 2019-06-28 DIAGNOSIS — M9905 Segmental and somatic dysfunction of pelvic region: Secondary | ICD-10-CM | POA: Diagnosis not present

## 2019-06-28 DIAGNOSIS — M9902 Segmental and somatic dysfunction of thoracic region: Secondary | ICD-10-CM | POA: Diagnosis not present

## 2019-08-07 DIAGNOSIS — S0181XA Laceration without foreign body of other part of head, initial encounter: Secondary | ICD-10-CM | POA: Diagnosis not present

## 2019-08-28 ENCOUNTER — Ambulatory Visit: Payer: Medicare HMO | Attending: Internal Medicine

## 2019-08-28 DIAGNOSIS — Z23 Encounter for immunization: Secondary | ICD-10-CM | POA: Insufficient documentation

## 2019-08-28 NOTE — Progress Notes (Signed)
   Covid-19 Vaccination Clinic  Name:  Joseph Saunders    MRN: [id number] DOB: Dec 26, 1955  08/28/2019  Mr. Joseph Saunders was observed post Covid-19 immunization for 15 minutes without incidence. He was provided with Vaccine Information Sheet and instruction to access the V-Safe system.   Mr. Joseph Saunders was instructed to call 911 with any severe reactions post vaccine: Marland Kitchen Difficulty breathing  . Swelling of your face and throat  . A fast heartbeat  . A bad rash all over your body  . Dizziness and weakness    Immunizations Administered    Name Date Dose VIS Date Route   Pfizer COVID-19 Vaccine 08/28/2019  3:32 PM 0.3 mL 06/30/2019 Intramuscular   Manufacturer: [place]   Lot: [id number]   [place]: [id number]

## 2019-09-22 ENCOUNTER — Ambulatory Visit: Payer: Medicare HMO | Attending: Internal Medicine

## 2019-09-22 DIAGNOSIS — Z23 Encounter for immunization: Secondary | ICD-10-CM | POA: Insufficient documentation

## 2019-09-22 NOTE — Progress Notes (Signed)
   Covid-19 Vaccination Clinic  Name:  Joseph Saunders    MRN: [id number] DOB: [birth year]  09/22/2019  Mr. Swango was observed post Covid-19 immunization for 15 minutes without incident. He was provided with Vaccine Information Sheet and instruction to access the V-Safe system.   Mr. Hatch was instructed to call 911 with any severe reactions post vaccine: Marland Kitchen Difficulty breathing  . Swelling of face and throat  . A fast heartbeat  . A bad rash all over body  . Dizziness and weakness   Immunizations Administered    Name Date Dose VIS Date Route   Pfizer COVID-19 Vaccine 09/22/2019 12:02 PM 0.3 mL 06/30/2019 Intramuscular   Manufacturer: [place]   Lot: [id number]   [place]: [id number]

## 2019-09-26 DIAGNOSIS — M9903 Segmental and somatic dysfunction of lumbar region: Secondary | ICD-10-CM | POA: Diagnosis not present

## 2019-09-26 DIAGNOSIS — M791 Myalgia, unspecified site: Secondary | ICD-10-CM | POA: Diagnosis not present

## 2019-09-26 DIAGNOSIS — M9902 Segmental and somatic dysfunction of thoracic region: Secondary | ICD-10-CM | POA: Diagnosis not present

## 2019-09-26 DIAGNOSIS — M9905 Segmental and somatic dysfunction of pelvic region: Secondary | ICD-10-CM | POA: Diagnosis not present
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
## 2020-01-30 DIAGNOSIS — M9905 Segmental and somatic dysfunction of pelvic region: Secondary | ICD-10-CM | POA: Diagnosis not present

## 2020-01-30 DIAGNOSIS — M9902 Segmental and somatic dysfunction of thoracic region: Secondary | ICD-10-CM | POA: Diagnosis not present

## 2020-01-30 DIAGNOSIS — M9903 Segmental and somatic dysfunction of lumbar region: Secondary | ICD-10-CM | POA: Diagnosis not present

## 2020-01-30 DIAGNOSIS — M791 Myalgia, unspecified site: Secondary | ICD-10-CM | POA: Diagnosis not present

## 2020-03-19 DIAGNOSIS — L821 Other seborrheic keratosis: Secondary | ICD-10-CM | POA: Diagnosis not present

## 2020-03-19 DIAGNOSIS — D225 Melanocytic nevi of trunk: Secondary | ICD-10-CM | POA: Diagnosis not present

## 2020-03-19 DIAGNOSIS — Z85828 Personal history of other malignant neoplasm of skin: Secondary | ICD-10-CM | POA: Diagnosis not present

## 2020-03-19 DIAGNOSIS — D224 Melanocytic nevi of scalp and neck: Secondary | ICD-10-CM | POA: Diagnosis not present

## 2020-03-19 DIAGNOSIS — D2262 Melanocytic nevi of left upper limb, including shoulder: Secondary | ICD-10-CM | POA: Diagnosis not present

## 2020-03-19 DIAGNOSIS — D2261 Melanocytic nevi of right upper limb, including shoulder: Secondary | ICD-10-CM | POA: Diagnosis not present

## 2020-03-19 DIAGNOSIS — L298 Other pruritus: Secondary | ICD-10-CM | POA: Diagnosis not present

## 2020-03-19 DIAGNOSIS — L814 Other melanin hyperpigmentation: Secondary | ICD-10-CM | POA: Diagnosis not present

## 2020-03-20 ENCOUNTER — Ambulatory Visit: Payer: Medicare Other | Admitting: Emergency Medicine

## 2020-03-20 ENCOUNTER — Encounter: Payer: Self-pay | Admitting: Emergency Medicine

## 2020-03-20 ENCOUNTER — Ambulatory Visit (INDEPENDENT_AMBULATORY_CARE_PROVIDER_SITE_OTHER): Payer: Medicare HMO | Admitting: Emergency Medicine

## 2020-03-20 ENCOUNTER — Other Ambulatory Visit: Payer: Self-pay

## 2020-03-20 VITALS — BP 105/66 | HR 59 | Temp 97.7°F | Resp 16 | Ht 73.0 in | Wt 158.0 lb

## 2020-03-20 DIAGNOSIS — N401 Enlarged prostate with lower urinary tract symptoms: Secondary | ICD-10-CM

## 2020-03-20 DIAGNOSIS — N399 Disorder of urinary system, unspecified: Secondary | ICD-10-CM | POA: Diagnosis not present

## 2020-03-20 DIAGNOSIS — N529 Male erectile dysfunction, unspecified: Secondary | ICD-10-CM

## 2020-03-20 LAB — POCT URINALYSIS DIP (MANUAL ENTRY)
Bilirubin, UA: NEGATIVE
Blood, UA: NEGATIVE
Glucose, UA: NEGATIVE mg/dL
Ketones, POC UA: NEGATIVE mg/dL
Leukocytes, UA: NEGATIVE
Nitrite, UA: NEGATIVE
Protein Ur, POC: NEGATIVE mg/dL
Spec Grav, UA: 1.015 (ref 1.010–1.025)
Urobilinogen, UA: 0.2 E.U./dL
pH, UA: 6.5 (ref 5.0–8.0)

## 2020-03-20 MED ORDER — TAMSULOSIN HCL 0.4 MG PO CAPS: 0.4000 mg | ORAL_CAPSULE | Freq: Every day | ORAL | 3 refills | Status: DC

## 2020-03-20 MED ORDER — SILDENAFIL CITRATE 100 MG PO TABS: 50.0000 mg | ORAL_TABLET | Freq: Every day | ORAL | 11 refills | Status: DC | PRN

## 2020-03-20 NOTE — Progress Notes (Signed)
Joseph Saunders 64 y.o.   Chief Complaint  Patient presents with  . urinary problem    for 2-3 months stream is very weak and wants prostate checked     HISTORY OF PRESENT ILLNESS: This is a 64 y.o. male complaining of lower urinary tract symptoms for the past 2 to 3 months.  Very weak stream.  Has a history of prostate enlargement.  Also complaining of erectile dysfunction.  No other significant associated symptoms. Fully vaccinated against Covid. No other complaints or medical concerns today.  HPI   Prior to Admission medications   Medication Sig Start Date End Date Taking? Authorizing Provider  fish oil-omega-3 fatty acids 1000 MG capsule Take 2 g by mouth daily.   Yes [provider]  Magnesium 400 MG TABS Take by mouth daily.   Yes [provider]  Multiple Vitamin (MULTI-VITAMINS) TABS Take by mouth.   Yes [provider]  vitamin E (VITAMIN E) 400 UNIT capsule Take 400 Units by mouth daily.   Yes [provider]  zoster vaccine live, PF, (ZOSTAVAX) 24268 UNT/0.65ML injection Inject 19,400 Units into the skin once. 07/29/15  Yes Wardell Honour, MD  aspirin 81 MG tablet Take 81 mg by mouth daily. Patient not taking: Reported on 03/20/2020    [provider]  FLUZONE QUADRIVALENT 0.5 ML injection ADM 0.5ML IM UTD Patient not taking: Reported on 03/20/2020 06/17/17   [provider]  lidocaine (LIDODERM) 5 % Place 1 patch onto the skin daily. Remove & Discard patch within 12 hours or as directed by MD Patient not taking: Reported on 03/20/2020 04/19/18   Suella Broad A, PA-C  Magnesium Carbonate (MAGNESIUM GLUCONATE) 54mg /8ml syringe Take by mouth. Patient not taking: Reported on 03/20/2020    [provider]  sildenafil (VIAGRA) 100 MG tablet Take 0.5-1 tablets (50-100 mg total) by mouth daily as needed for erectile dysfunction. 03/20/20   Horald Pollen, MD  [place] Wort 1000 MG CAPS Take by mouth. Patient not  taking: Reported on 03/20/2020    [provider]  tamsulosin (FLOMAX) 0.4 MG CAPS capsule Take 1 capsule (0.4 mg total) by mouth daily. 03/20/20   Horald Pollen, MD    Allergies  Allergen Reactions  . Sulfa Antibiotics Nausea And Vomiting    There are no problems to display for this patient.   Past Medical History:  Diagnosis Date  . 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 On fluoxetine.  . Benign positional vertigo   . Cataract   . History of chicken pox   . Mitral valve prolapse     Past Surgical History:  Procedure Laterality Date  . TONSILECTOMY, ADENOIDECTOMY, BILATERAL MYRINGOTOMY AND TUBES      Social History   Socioeconomic History  . Marital status: Married    Spouse name: Not on file  . Number of children: 4  . Years of education: Not on file  . Highest education level: Not on file  Occupational History  . 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 Smoking status: Never Smoker  . Smokeless tobacco: Never Used  Substance and Sexual Activity  . Alcohol use: Yes    Alcohol/week: 2.0 standard drinks    Types: 1 Glasses of wine, 1 Cans of beer per week    Comment: 1 drink daily in the past  . Drug use: No  . Sexual activity: Yes  Partners: Female    Comment: married  Other Topics Concern  . Not on file  Social History Narrative   Marital status:  Married x 31 years      Children: 4 (3 biological, 1 adopted).  No grandchildren; one on the way in 2017.      Lives: with wife, 3 children.      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      Tobacco: none      Alcohol: one drink per night with dinner.  3-4 drinks per week.  Beer and wine.      Drugs:  None      Exercise:  Walking. Weight lifting 3 days per week.       Diet: healthy      Seatbelts: 100%      Guns: none      Education: College/Other.   Religion: [place], good support    Social Determinants  of Radio broadcast assistant Strain:   . Difficulty of Paying Living Expenses: Not on file  Food Insecurity:   . Worried About Charity fundraiser in the Last Year: Not on file  . Ran Out of Food in the Last Year: Not on file  Transportation Needs:   . Lack of Transportation (Medical): Not on file  . Lack of Transportation (Non-Medical): Not on file  Physical Activity:   . Days of Exercise per Week: Not on file  . Minutes of Exercise per Session: Not on file  Stress:   . Feeling of Stress : Not on file  Social Connections:   . Frequency of Communication with Friends and Family: Not on file  . Frequency of Social Gatherings with Friends and Family: Not on file  . Attends Religious Services: Not on file  . Active Member of Clubs or Organizations: Not on file  . Attends Archivist Meetings: Not on file  . Marital Status: Not on file  Intimate Partner Violence:   . Fear of Current or Ex-Partner: Not on file  . Emotionally Abused: Not on file  . Physically Abused: Not on file  . Sexually Abused: Not on file    Family History  Problem Relation Age of Onset  . Heart disease Father 84       CHF; smoking/tobacco  . Diverticulitis Brother   . Mental illness Brother   . Heart disease Paternal Grandfather   . Abnormal EKG Paternal Grandfather   . Osteoporosis Mother   . Mental illness Brother   . Cancer Maternal Grandmother   . Hypertension Paternal Grandmother   . Lung cancer Maternal Grandfather      Review of Systems  Constitutional: Negative.  Negative for chills and fever.  HENT: Negative.  Negative for congestion and sore throat.   Respiratory: Negative.  Negative for cough and shortness of breath.   Cardiovascular: Negative.  Negative for chest pain and palpitations.  Gastrointestinal: Negative.  Negative for abdominal pain, diarrhea, nausea and vomiting.  Genitourinary: Positive for urgency. Negative for dysuria and hematuria.       Weak stream Erectile  dysfunction  Skin: Negative.  Negative for rash.  Neurological: Negative.  Negative for dizziness and headaches.  All other systems reviewed and are negative.   Today's Vitals   03/20/20 1442  BP: 105/66  Pulse: (!) 59  Resp: 16  Temp: 97.7 F (36.5 C)  TempSrc: Temporal  SpO2: 97%  Weight: 158 lb (71.7 kg)  Height: 6\' 1"  (1.854  m)   Body mass index is 20.85 kg/m.  Physical Exam Vitals reviewed.  Constitutional:      Appearance: Normal appearance.  HENT:     Head: Normocephalic.  Eyes:     Extraocular Movements: Extraocular movements intact.  Cardiovascular:     Rate and Rhythm: Normal rate.  Pulmonary:     Effort: Pulmonary effort is normal.  Musculoskeletal:     Cervical back: Normal range of motion.  Skin:    General: Skin is warm and dry.  Neurological:     General: No focal deficit present.     Mental Status: He is alert and oriented to person, place, and time.  Psychiatric:        Mood and Affect: Mood normal.        Behavior: Behavior normal.    Results for orders placed or performed in visit on 03/20/20 (from the past 24 hour(s))  POCT urinalysis dipstick     Status: None   Collection Time: 03/20/20  3:08 PM  Result Value Ref Range   Color, UA yellow yellow   Clarity, UA clear clear   Glucose, UA negative negative mg/dL   Bilirubin, UA negative negative   Ketones, POC UA negative negative mg/dL   Spec Grav, UA 1.015 1.010 - 1.025   Blood, UA negative negative   pH, UA 6.5 5.0 - 8.0   Protein Ur, POC negative negative mg/dL   Urobilinogen, UA 0.2 0.2 or 1.0 E.U./dL   Nitrite, UA Negative Negative   Leukocytes, UA Negative Negative     ASSESSMENT & PLAN: Shivaay was seen today for urinary problem.  Diagnoses and all orders for this visit:  Lower urinary tract symptoms due to benign prostatic hyperplasia -     Cancel: PSA, Medicare -     Comprehensive metabolic panel -     CBC with Differential/Platelet -     tamsulosin (FLOMAX) 0.4 MG  CAPS capsule; Take 1 capsule (0.4 mg total) by mouth daily. -     PSA -     Ambulatory referral to Urology  Urinary problem in male -     POCT urinalysis dipstick -     PSA  Erectile dysfunction, unspecified erectile dysfunction type -     sildenafil (VIAGRA) 100 MG tablet; Take 0.5-1 tablets (50-100 mg total) by mouth daily as needed for erectile dysfunction. -     PSA    Patient Instructions       If you have lab work done today you will be contacted with your lab results within the next 2 weeks.  If you have not heard from [place] then please contact us. The fastest way to get your results is to register for My Chart.   IF you received an x-ray today, you will receive an invoice from [facility]. Please contact [facility] at [phone] with questions or concerns regarding your invoice.   IF you received labwork today, you will receive an invoice from [facility]. Please contact [facility] at [phone] with questions or concerns regarding your invoice.   Our billing staff will not be able to assist you with questions regarding bills from these companies.  You will be contacted with the lab results as soon as they are available. The fastest way to get your results is to activate your My Chart account. Instructions are located on the last page of this paperwork. If you have not heard from [place] regarding the results in 2 weeks, please contact this office.  Benign Prostatic Hyperplasia  Benign prostatic hyperplasia (BPH) is an enlarged prostate gland that is caused by the normal aging process and not by cancer. The prostate is a walnut-sized gland that is involved in the production of semen. It is located in front of the rectum and below the bladder. The bladder stores urine and the urethra is the tube that carries the urine out of the body. The prostate may get bigger as a man gets older. An enlarged prostate can press on the urethra. This can make it harder to pass  urine. The build-up of urine in the bladder can cause infection. Back pressure and infection may progress to bladder damage and kidney (renal) failure. What are the causes? This condition is part of a normal aging process. However, not all men develop problems from this condition. If the prostate enlarges away from the urethra, urine flow will not be blocked. If it enlarges toward the urethra and compresses it, there will be problems passing urine. What increases the risk? This condition is more likely to develop in men over the age of 72 years. What are the signs or symptoms? Symptoms of this condition include:  Getting up often during the night to urinate.  Needing to urinate frequently during the day.  Difficulty starting urine flow.  Decrease in size and strength of your urine stream.  Leaking (dribbling) after urinating.  Inability to pass urine. This needs immediate treatment.  Inability to completely empty your bladder.  Pain when you pass urine. This is more common if there is also an infection.  Urinary tract infection (UTI). How is this diagnosed? This condition is diagnosed based on your medical history, a physical exam, and your symptoms. Tests will also be done, such as:  A post-void bladder scan. This measures any amount of urine that may remain in your bladder after you finish urinating.  A digital rectal exam. In a rectal exam, your health care provider checks your prostate by putting a lubricated, gloved finger into your rectum to feel the back of your prostate gland. This exam detects the size of your gland and any abnormal lumps or growths.  An exam of your urine (urinalysis).  A prostate specific antigen (PSA) screening. This is a blood test used to screen for prostate cancer.  An ultrasound. This test uses sound waves to electronically produce a picture of your prostate gland. Your health care provider may refer you to a specialist in kidney and prostate  diseases (urologist). How is this treated? Once symptoms begin, your health care provider will monitor your condition (active surveillance or watchful waiting). Treatment for this condition will depend on the severity of your condition. Treatment may include:  Observation and yearly exams. This may be the only treatment needed if your condition and symptoms are mild.  Medicines to relieve your symptoms, including: ? Medicines to shrink the prostate. ? Medicines to relax the muscle of the prostate.  Surgery in severe cases. Surgery may include: ? Prostatectomy. In this procedure, the prostate tissue is removed completely through an open incision or with a laparoscope or robotics. ? Transurethral resection of the prostate (TURP). In this procedure, a tool is inserted through the opening at the tip of the penis (urethra). It is used to cut away tissue of the inner core of the prostate. The pieces are removed through the same opening of the penis. This removes the blockage. ? Transurethral incision (TUIP). In this procedure, small cuts are made in the prostate. This  lessens the prostate's pressure on the urethra. ? Transurethral microwave thermotherapy (TUMT). This procedure uses microwaves to create heat. The heat destroys and removes a small amount of prostate tissue. ? Transurethral needle ablation (TUNA). This procedure uses radio frequencies to destroy and remove a small amount of prostate tissue. ? Interstitial laser coagulation ([place]). This procedure uses a laser to destroy and remove a small amount of prostate tissue. ? Transurethral electrovaporization (TUVP). This procedure uses electrodes to destroy and remove a small amount of prostate tissue. ? Prostatic urethral lift. This procedure inserts an implant to push the lobes of the prostate away from the urethra. Follow these instructions at home:  Take over-the-counter and prescription medicines only as told by your health care  provider.  Monitor your symptoms for any changes. Contact your health care provider with any changes.  Avoid drinking large amounts of liquid before going to bed or out in public.  Avoid or reduce how much caffeine or alcohol you drink.  Give yourself time when you urinate.  Keep all follow-up visits as told by your health care provider. This is important. Contact a health care provider if:  You have unexplained back pain.  Your symptoms do not get better with treatment.  You develop side effects from the medicine you are taking.  Your urine becomes very dark or has a bad smell.  Your lower abdomen becomes distended and you have trouble passing your urine. Get help right away if:  You have a fever or chills.  You suddenly cannot urinate.  You feel lightheaded, or very dizzy, or you faint.  There are large amounts of blood or clots in the urine.  Your urinary problems become hard to manage.  You develop moderate to severe low back or flank pain. The flank is the side of your body between the ribs and the hip. These symptoms may represent a serious problem that is an emergency. Do not wait to see if the symptoms will go away. Get medical help right away. Call your local emergency services (911 in the [place] Do not drive yourself to the hospital. Summary  Benign prostatic hyperplasia (BPH) is an enlarged prostate that is caused by the normal aging process and not by cancer.  An enlarged prostate can press on the urethra. This can make it hard to pass urine.  This condition is part of a normal aging process and is more likely to develop in men over the age of 53 years.  Get help right away if you suddenly cannot urinate. This information is not intended to replace advice given to you by your health care provider. Make sure you discuss any questions you have with your health care provider. Document Revised: 05/31/2018 Document Reviewed: 08/10/2016 Elsevier Patient Education   2020 Elsevier Inc.      Agustina Caroli, MD [facility]

## 2020-03-20 NOTE — Patient Instructions (Addendum)
If you have lab work done today you will be contacted with your lab results within the next 2 weeks.  If you have not heard from [place] then please contact us. The fastest way to get your results is to register for My Chart.   IF you received an x-ray today, you will receive an invoice from [facility]. Please contact [facility] at [phone] with questions or concerns regarding your invoice.   IF you received labwork today, you will receive an invoice from [facility]. Please contact [facility] at [phone] with questions or concerns regarding your invoice.   Our billing staff will not be able to assist you with questions regarding bills from these companies.  You will be contacted with the lab results as soon as they are available. The fastest way to get your results is to activate your My Chart account. Instructions are located on the last page of this paperwork. If you have not heard from [place] regarding the results in 2 weeks, please contact this office.     Benign Prostatic Hyperplasia  Benign prostatic hyperplasia (BPH) is an enlarged prostate gland that is caused by the normal aging process and not by cancer. The prostate is a walnut-sized gland that is involved in the production of semen. It is located in front of the rectum and below the bladder. The bladder stores urine and the urethra is the tube that carries the urine out of the body. The prostate may get bigger as a man gets older. An enlarged prostate can press on the urethra. This can make it harder to pass urine. The build-up of urine in the bladder can cause infection. Back pressure and infection may progress to bladder damage and kidney (renal) failure. What are the causes? This condition is part of a normal aging process. However, not all men develop problems from this condition. If the prostate enlarges away from the urethra, urine flow will not be blocked. If it enlarges toward the urethra and compresses  it, there will be problems passing urine. What increases the risk? This condition is more likely to develop in men over the age of 13 years. What are the signs or symptoms? Symptoms of this condition include:  Getting up often during the night to urinate.  Needing to urinate frequently during the day.  Difficulty starting urine flow.  Decrease in size and strength of your urine stream.  Leaking (dribbling) after urinating.  Inability to pass urine. This needs immediate treatment.  Inability to completely empty your bladder.  Pain when you pass urine. This is more common if there is also an infection.  Urinary tract infection (UTI). How is this diagnosed? This condition is diagnosed based on your medical history, a physical exam, and your symptoms. Tests will also be done, such as:  A post-void bladder scan. This measures any amount of urine that may remain in your bladder after you finish urinating.  A digital rectal exam. In a rectal exam, your health care provider checks your prostate by putting a lubricated, gloved finger into your rectum to feel the back of your prostate gland. This exam detects the size of your gland and any abnormal lumps or growths.  An exam of your urine (urinalysis).  A prostate specific antigen (PSA) screening. This is a blood test used to screen for prostate cancer.  An ultrasound. This test uses sound waves to electronically produce a picture of your prostate gland. Your health care provider may refer you to  a specialist in kidney and prostate diseases (urologist). How is this treated? Once symptoms begin, your health care provider will monitor your condition (active surveillance or watchful waiting). Treatment for this condition will depend on the severity of your condition. Treatment may include:  Observation and yearly exams. This may be the only treatment needed if your condition and symptoms are mild.  Medicines to relieve your symptoms,  including: ? Medicines to shrink the prostate. ? Medicines to relax the muscle of the prostate.  Surgery in severe cases. Surgery may include: ? Prostatectomy. In this procedure, the prostate tissue is removed completely through an open incision or with a laparoscope or robotics. ? Transurethral resection of the prostate (TURP). In this procedure, a tool is inserted through the opening at the tip of the penis (urethra). It is used to cut away tissue of the inner core of the prostate. The pieces are removed through the same opening of the penis. This removes the blockage. ? Transurethral incision (TUIP). In this procedure, small cuts are made in the prostate. This lessens the prostate's pressure on the urethra. ? Transurethral microwave thermotherapy (TUMT). This procedure uses microwaves to create heat. The heat destroys and removes a small amount of prostate tissue. ? Transurethral needle ablation (TUNA). This procedure uses radio frequencies to destroy and remove a small amount of prostate tissue. ? Interstitial laser coagulation ([place]). This procedure uses a laser to destroy and remove a small amount of prostate tissue. ? Transurethral electrovaporization (TUVP). This procedure uses electrodes to destroy and remove a small amount of prostate tissue. ? Prostatic urethral lift. This procedure inserts an implant to push the lobes of the prostate away from the urethra. Follow these instructions at home:  Take over-the-counter and prescription medicines only as told by your health care provider.  Monitor your symptoms for any changes. Contact your health care provider with any changes.  Avoid drinking large amounts of liquid before going to bed or out in public.  Avoid or reduce how much caffeine or alcohol you drink.  Give yourself time when you urinate.  Keep all follow-up visits as told by your health care provider. This is important. Contact a health care provider if:  You have  unexplained back pain.  Your symptoms do not get better with treatment.  You develop side effects from the medicine you are taking.  Your urine becomes very dark or has a bad smell.  Your lower abdomen becomes distended and you have trouble passing your urine. Get help right away if:  You have a fever or chills.  You suddenly cannot urinate.  You feel lightheaded, or very dizzy, or you faint.  There are large amounts of blood or clots in the urine.  Your urinary problems become hard to manage.  You develop moderate to severe low back or flank pain. The flank is the side of your body between the ribs and the hip. These symptoms may represent a serious problem that is an emergency. Do not wait to see if the symptoms will go away. Get medical help right away. Call your local emergency services (911 in the [place] Do not drive yourself to the hospital. Summary  Benign prostatic hyperplasia (BPH) is an enlarged prostate that is caused by the normal aging process and not by cancer.  An enlarged prostate can press on the urethra. This can make it hard to pass urine.  This condition is part of a normal aging process and is more likely to develop in  men over the age of 85 years.  Get help right away if you suddenly cannot urinate. This information is not intended to replace advice given to you by your health care provider. Make sure you discuss any questions you have with your health care provider. Document Revised: 05/31/2018 Document Reviewed: 08/10/2016 Elsevier Patient Education  2020 [employer].

## 2020-03-21 LAB — CBC WITH DIFFERENTIAL/PLATELET
Basophils Absolute: 0.1 10*3/uL (ref 0.0–0.2)
Basos: 1 %
EOS (ABSOLUTE): 0.1 10*3/uL (ref 0.0–0.4)
Eos: 1 %
Hematocrit: 44.3 % (ref 37.5–51.0)
Hemoglobin: 15.1 g/dL (ref 13.0–17.7)
Immature Grans (Abs): 0 10*3/uL (ref 0.0–0.1)
Immature Granulocytes: 0 %
Lymphocytes Absolute: 1.3 10*3/uL (ref 0.7–3.1)
Lymphs: 12 %
MCH: 30.8 pg (ref 26.6–33.0)
MCHC: 34.1 g/dL (ref 31.5–35.7)
MCV: 90 fL (ref 79–97)
Monocytes Absolute: 0.6 10*3/uL (ref 0.1–0.9)
Monocytes: 6 %
Neutrophils Absolute: 9.1 10*3/uL — ABNORMAL HIGH (ref 1.4–7.0)
Neutrophils: 80 %
Platelets: 305 10*3/uL (ref 150–450)
RBC: 4.91 x10E6/uL (ref 4.14–5.80)
RDW: 12.1 % (ref 11.6–15.4)
WBC: 11.3 10*3/uL — ABNORMAL HIGH (ref 3.4–10.8)

## 2020-03-21 LAB — COMPREHENSIVE METABOLIC PANEL
ALT: 19 IU/L (ref 0–44)
AST: 22 IU/L (ref 0–40)
Albumin/Globulin Ratio: 2.6 — ABNORMAL HIGH (ref 1.2–2.2)
Albumin: 4.5 g/dL (ref 3.8–4.8)
Alkaline Phosphatase: 109 IU/L (ref 48–121)
BUN/Creatinine Ratio: 14 (ref 10–24)
BUN: 16 mg/dL (ref 8–27)
Bilirubin Total: 0.6 mg/dL (ref 0.0–1.2)
CO2: 26 mmol/L (ref 20–29)
Calcium: 9.5 mg/dL (ref 8.6–10.2)
Chloride: 101 mmol/L (ref 96–106)
Creatinine, Ser: 1.13 mg/dL (ref 0.76–1.27)
GFR calc Af Amer: 78 mL/min/{1.73_m2} (ref >59)
GFR calc non Af Amer: 67 mL/min/{1.73_m2} (ref >59)
Globulin, Total: 1.7 g/dL (ref 1.5–4.5)
Glucose: 98 mg/dL (ref 65–99)
Potassium: 4.3 mmol/L (ref 3.5–5.2)
Sodium: 142 mmol/L (ref 134–144)
Total Protein: 6.2 g/dL (ref 6.0–8.5)

## 2020-03-21 LAB — PSA: Prostate Specific Ag, Serum: 1.7 ng/mL (ref 0.0–4.0)

## 2020-06-17 DIAGNOSIS — M9902 Segmental and somatic dysfunction of thoracic region: Secondary | ICD-10-CM | POA: Diagnosis not present

## 2020-06-17 DIAGNOSIS — M791 Myalgia, unspecified site: Secondary | ICD-10-CM | POA: Diagnosis not present

## 2020-06-17 DIAGNOSIS — M9903 Segmental and somatic dysfunction of lumbar region: Secondary | ICD-10-CM | POA: Diagnosis not present

## 2020-06-17 DIAGNOSIS — M9905 Segmental and somatic dysfunction of pelvic region: Secondary | ICD-10-CM | POA: Diagnosis not present

## 2020-08-19 DIAGNOSIS — N529 Male erectile dysfunction, unspecified: Secondary | ICD-10-CM | POA: Diagnosis not present

## 2020-08-19 DIAGNOSIS — B372 Candidiasis of skin and nail: Secondary | ICD-10-CM | POA: Diagnosis not present

## 2020-09-19 DIAGNOSIS — L820 Inflamed seborrheic keratosis: Secondary | ICD-10-CM | POA: Diagnosis not present

## 2020-10-05 DIAGNOSIS — Z20822 Contact with and (suspected) exposure to covid-19: Secondary | ICD-10-CM | POA: Diagnosis not present

## 2020-12-04 DIAGNOSIS — M9903 Segmental and somatic dysfunction of lumbar region: Secondary | ICD-10-CM | POA: Diagnosis not present

## 2020-12-04 DIAGNOSIS — M9905 Segmental and somatic dysfunction of pelvic region: Secondary | ICD-10-CM | POA: Diagnosis not present

## 2020-12-04 DIAGNOSIS — M791 Myalgia, unspecified site: Secondary | ICD-10-CM | POA: Diagnosis not present

## 2020-12-04 DIAGNOSIS — M9902 Segmental and somatic dysfunction of thoracic region: Secondary | ICD-10-CM | POA: Diagnosis not present

## 2020-12-09 DIAGNOSIS — M9905 Segmental and somatic dysfunction of pelvic region: Secondary | ICD-10-CM | POA: Diagnosis not present

## 2020-12-09 DIAGNOSIS — M9902 Segmental and somatic dysfunction of thoracic region: Secondary | ICD-10-CM | POA: Diagnosis not present

## 2020-12-09 DIAGNOSIS — M9903 Segmental and somatic dysfunction of lumbar region: Secondary | ICD-10-CM | POA: Diagnosis not present

## 2020-12-09 DIAGNOSIS — M791 Myalgia, unspecified site: Secondary | ICD-10-CM | POA: Diagnosis not present

## 2021-01-09 DIAGNOSIS — H25013 Cortical age-related cataract, bilateral: Secondary | ICD-10-CM | POA: Diagnosis not present

## 2021-01-09 DIAGNOSIS — H18413 Arcus senilis, bilateral: Secondary | ICD-10-CM | POA: Diagnosis not present

## 2021-01-09 DIAGNOSIS — H25043 Posterior subcapsular polar age-related cataract, bilateral: Secondary | ICD-10-CM | POA: Diagnosis not present

## 2021-01-09 DIAGNOSIS — H2513 Age-related nuclear cataract, bilateral: Secondary | ICD-10-CM | POA: Diagnosis not present

## 2021-01-09 DIAGNOSIS — H2511 Age-related nuclear cataract, right eye: Secondary | ICD-10-CM | POA: Diagnosis not present

## 2021-02-26 DIAGNOSIS — M9905 Segmental and somatic dysfunction of pelvic region: Secondary | ICD-10-CM | POA: Diagnosis not present

## 2021-02-26 DIAGNOSIS — M9903 Segmental and somatic dysfunction of lumbar region: Secondary | ICD-10-CM | POA: Diagnosis not present

## 2021-02-26 DIAGNOSIS — M9902 Segmental and somatic dysfunction of thoracic region: Secondary | ICD-10-CM | POA: Diagnosis not present

## 2021-02-26 DIAGNOSIS — M791 Myalgia, unspecified site: Secondary | ICD-10-CM | POA: Diagnosis not present

## 2021-03-25 DIAGNOSIS — L570 Actinic keratosis: Secondary | ICD-10-CM | POA: Diagnosis not present

## 2021-03-25 DIAGNOSIS — D225 Melanocytic nevi of trunk: Secondary | ICD-10-CM | POA: Diagnosis not present

## 2021-03-25 DIAGNOSIS — L814 Other melanin hyperpigmentation: Secondary | ICD-10-CM | POA: Diagnosis not present

## 2021-03-25 DIAGNOSIS — L821 Other seborrheic keratosis: Secondary | ICD-10-CM | POA: Diagnosis not present

## 2021-03-25 DIAGNOSIS — L820 Inflamed seborrheic keratosis: Secondary | ICD-10-CM | POA: Diagnosis not present

## 2021-03-25 DIAGNOSIS — Z85828 Personal history of other malignant neoplasm of skin: Secondary | ICD-10-CM | POA: Diagnosis not present

## 2021-03-25 DIAGNOSIS — D2272 Melanocytic nevi of left lower limb, including hip: Secondary | ICD-10-CM | POA: Diagnosis not present

## 2021-03-25 DIAGNOSIS — D2271 Melanocytic nevi of right lower limb, including hip: Secondary | ICD-10-CM | POA: Diagnosis not present

## 2021-03-25 DIAGNOSIS — L309 Dermatitis, unspecified: Secondary | ICD-10-CM | POA: Diagnosis not present

## 2021-04-02 DIAGNOSIS — Z125 Encounter for screening for malignant neoplasm of prostate: Secondary | ICD-10-CM | POA: Diagnosis not present

## 2021-04-02 DIAGNOSIS — N529 Male erectile dysfunction, unspecified: Secondary | ICD-10-CM | POA: Diagnosis not present

## 2021-04-09 DIAGNOSIS — Z0000 Encounter for general adult medical examination without abnormal findings: Secondary | ICD-10-CM | POA: Diagnosis not present

## 2021-04-09 DIAGNOSIS — Z1339 Encounter for screening examination for other mental health and behavioral disorders: Secondary | ICD-10-CM | POA: Diagnosis not present

## 2021-04-09 DIAGNOSIS — Z23 Encounter for immunization: Secondary | ICD-10-CM | POA: Diagnosis not present

## 2021-04-09 DIAGNOSIS — N529 Male erectile dysfunction, unspecified: Secondary | ICD-10-CM | POA: Diagnosis not present

## 2021-04-09 DIAGNOSIS — B372 Candidiasis of skin and nail: Secondary | ICD-10-CM | POA: Diagnosis not present

## 2021-04-09 DIAGNOSIS — Z1331 Encounter for screening for depression: Secondary | ICD-10-CM | POA: Diagnosis not present

## 2021-05-14 DIAGNOSIS — H2511 Age-related nuclear cataract, right eye: Secondary | ICD-10-CM | POA: Diagnosis not present

## 2021-05-15 DIAGNOSIS — H2512 Age-related nuclear cataract, left eye: Secondary | ICD-10-CM | POA: Diagnosis not present

## 2021-05-28 DIAGNOSIS — H2512 Age-related nuclear cataract, left eye: Secondary | ICD-10-CM | POA: Diagnosis not present

## 2021-10-20 DIAGNOSIS — R058 Other specified cough: Secondary | ICD-10-CM | POA: Diagnosis not present

## 2021-10-20 DIAGNOSIS — J189 Pneumonia, unspecified organism: Secondary | ICD-10-CM | POA: Diagnosis not present

## 2021-10-20 DIAGNOSIS — Z1152 Encounter for screening for COVID-19: Secondary | ICD-10-CM | POA: Diagnosis not present

## 2021-10-20 DIAGNOSIS — R5383 Other fatigue: Secondary | ICD-10-CM | POA: Diagnosis not present

## 2021-10-20 DIAGNOSIS — R051 Acute cough: Secondary | ICD-10-CM | POA: Diagnosis not present

## 2021-11-04 DIAGNOSIS — J189 Pneumonia, unspecified organism: Secondary | ICD-10-CM | POA: Diagnosis not present

## 2022-01-28 DIAGNOSIS — H52203 Unspecified astigmatism, bilateral: Secondary | ICD-10-CM | POA: Diagnosis not present

## 2022-01-28 DIAGNOSIS — Z961 Presence of intraocular lens: Secondary | ICD-10-CM | POA: Diagnosis not present

## 2022-01-28 DIAGNOSIS — H524 Presbyopia: Secondary | ICD-10-CM | POA: Diagnosis not present

## 2022-01-31 DIAGNOSIS — X58XXXA Exposure to other specified factors, initial encounter: Secondary | ICD-10-CM | POA: Diagnosis not present

## 2022-01-31 DIAGNOSIS — S81811A Laceration without foreign body, right lower leg, initial encounter: Secondary | ICD-10-CM | POA: Diagnosis not present

## 2022-01-31 DIAGNOSIS — Z23 Encounter for immunization: Secondary | ICD-10-CM | POA: Diagnosis not present

## 2022-02-13 DIAGNOSIS — S81811D Laceration without foreign body, right lower leg, subsequent encounter: Secondary | ICD-10-CM | POA: Diagnosis not present

## 2022-02-13 DIAGNOSIS — Z4802 Encounter for removal of sutures: Secondary | ICD-10-CM | POA: Diagnosis not present

## 2022-02-16 DIAGNOSIS — M9905 Segmental and somatic dysfunction of pelvic region: Secondary | ICD-10-CM | POA: Diagnosis not present

## 2022-02-16 DIAGNOSIS — M9903 Segmental and somatic dysfunction of lumbar region: Secondary | ICD-10-CM | POA: Diagnosis not present

## 2022-02-16 DIAGNOSIS — M9902 Segmental and somatic dysfunction of thoracic region: Secondary | ICD-10-CM | POA: Diagnosis not present

## 2022-02-16 DIAGNOSIS — M791 Myalgia, unspecified site: Secondary | ICD-10-CM | POA: Diagnosis not present

## 2022-04-02 DIAGNOSIS — L814 Other melanin hyperpigmentation: Secondary | ICD-10-CM | POA: Diagnosis not present

## 2022-04-02 DIAGNOSIS — Z85828 Personal history of other malignant neoplasm of skin: Secondary | ICD-10-CM | POA: Diagnosis not present

## 2022-04-02 DIAGNOSIS — D485 Neoplasm of uncertain behavior of skin: Secondary | ICD-10-CM | POA: Diagnosis not present

## 2022-04-02 DIAGNOSIS — L821 Other seborrheic keratosis: Secondary | ICD-10-CM | POA: Diagnosis not present

## 2022-04-02 DIAGNOSIS — D225 Melanocytic nevi of trunk: Secondary | ICD-10-CM | POA: Diagnosis not present

## 2022-04-02 DIAGNOSIS — L308 Other specified dermatitis: Secondary | ICD-10-CM | POA: Diagnosis not present

## 2022-04-02 DIAGNOSIS — D2221 Melanocytic nevi of right ear and external auricular canal: Secondary | ICD-10-CM | POA: Diagnosis not present

## 2022-04-08 DIAGNOSIS — N529 Male erectile dysfunction, unspecified: Secondary | ICD-10-CM | POA: Diagnosis not present

## 2022-04-08 DIAGNOSIS — R5383 Other fatigue: Secondary | ICD-10-CM | POA: Diagnosis not present

## 2022-04-08 DIAGNOSIS — R7989 Other specified abnormal findings of blood chemistry: Secondary | ICD-10-CM | POA: Diagnosis not present

## 2022-04-08 DIAGNOSIS — Z125 Encounter for screening for malignant neoplasm of prostate: Secondary | ICD-10-CM | POA: Diagnosis not present

## 2022-04-11 DIAGNOSIS — W208XXA Other cause of strike by thrown, projected or falling object, initial encounter: Secondary | ICD-10-CM | POA: Diagnosis not present

## 2022-04-11 DIAGNOSIS — S9032XA Contusion of left foot, initial encounter: Secondary | ICD-10-CM | POA: Diagnosis not present

## 2022-04-11 DIAGNOSIS — M79672 Pain in left foot: Secondary | ICD-10-CM | POA: Diagnosis not present

## 2022-04-15 DIAGNOSIS — N529 Male erectile dysfunction, unspecified: Secondary | ICD-10-CM | POA: Diagnosis not present

## 2022-04-15 DIAGNOSIS — Z1331 Encounter for screening for depression: Secondary | ICD-10-CM | POA: Diagnosis not present

## 2022-04-15 DIAGNOSIS — Z1339 Encounter for screening examination for other mental health and behavioral disorders: Secondary | ICD-10-CM | POA: Diagnosis not present

## 2022-04-15 DIAGNOSIS — B372 Candidiasis of skin and nail: Secondary | ICD-10-CM | POA: Diagnosis not present

## 2022-04-15 DIAGNOSIS — Z23 Encounter for immunization: Secondary | ICD-10-CM | POA: Diagnosis not present

## 2022-04-15 DIAGNOSIS — Z0000 Encounter for general adult medical examination without abnormal findings: Secondary | ICD-10-CM | POA: Diagnosis not present

## 2022-09-10 DIAGNOSIS — R0602 Shortness of breath: Secondary | ICD-10-CM | POA: Diagnosis not present

## 2022-09-10 DIAGNOSIS — R0981 Nasal congestion: Secondary | ICD-10-CM | POA: Diagnosis not present

## 2022-09-10 DIAGNOSIS — R058 Other specified cough: Secondary | ICD-10-CM | POA: Diagnosis not present

## 2022-09-10 DIAGNOSIS — Z1152 Encounter for screening for COVID-19: Secondary | ICD-10-CM | POA: Diagnosis not present

## 2022-09-10 DIAGNOSIS — R5383 Other fatigue: Secondary | ICD-10-CM | POA: Diagnosis not present

## 2022-09-10 DIAGNOSIS — J189 Pneumonia, unspecified organism: Secondary | ICD-10-CM | POA: Diagnosis not present

## 2022-09-10 DIAGNOSIS — R9389 Abnormal findings on diagnostic imaging of other specified body structures: Secondary | ICD-10-CM | POA: Diagnosis not present

## 2022-10-01 ENCOUNTER — Ambulatory Visit: Payer: Medicare HMO | Admitting: Internal Medicine

## 2022-10-01 ENCOUNTER — Encounter: Payer: Self-pay | Admitting: Internal Medicine

## 2022-10-01 VITALS — BP 112/64 | HR 77 | Temp 98.0°F | Ht 73.0 in | Wt 155.4 lb

## 2022-10-01 DIAGNOSIS — R053 Chronic cough: Secondary | ICD-10-CM

## 2022-10-01 DIAGNOSIS — Z8701 Personal history of pneumonia (recurrent): Secondary | ICD-10-CM

## 2022-10-01 DIAGNOSIS — Z9189 Other specified personal risk factors, not elsewhere classified: Secondary | ICD-10-CM | POA: Diagnosis not present

## 2022-10-01 NOTE — Progress Notes (Signed)
OV 10/01/2022  Subjective:  Patient ID: Joseph Saunders, male , DOB: [birth year] , age 67 y.o. , MRN: [id number] , ADDRESS: [address] [place] [address] PCP Horald Pollen, MD Patient Care Team: Horald Pollen, MD as PCP - General (Internal Medicine)  This Provider for this visit: Treatment Team:  Attending Provider: Brand Males, MD    10/01/2022 -   Chief Complaint  Patient presents with   Consult    Abn CXR. Recurring pneumonias x 2 years.  C/o cough with exertion or stress x 6 months.     HPI Joseph Saunders 67 y.o. -presents with his wife.  He and his wife are good friends with [employer] family [I took care of their dad who had IPF].  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  He tells me that a year ago in 2023 Easter he had severe symptoms of cough and green sputum and was diagnosed to have pneumonia on a chest x-ray by primary care physician.  He also lost significant amount of weight.  He was given Levaquin and then he improved.  I do not have the chest x-ray for my visualization.  And then sometime in the summer 2023 started developing insidious onset of chronic cough.  It was a dry cough.  It presented when he would take a deep breath.  Also presented would get worse when he tried to move particular to the right lateral side.  It would wake him up at night.  The cough did get better with cough drops.  Also lying in a semirecumbent position made the cough better but there is no wheezing or shortness of breath.  In the backdrop of this approximately in September 06, 2022 he picked up fever cough and green sputum after being exposed to granddaughter who was sick.  Wife also got similar illness but wife and granddaughter recovered quickly but he got significantly worse with weight loss.  He is a chest x-ray again showed pneumonia but this time in a different location [again I do not have these images for my  visualization] but did confirm this history but review of the external records at primary care office.  He was then given Levaquin.  After the Levaquin is gained his weight back his cough is improved his energy level is improved but is still far away from his baseline cough and his energy levels are not fully back at baseline.  He drinks socially a few drinks a week.  When he was a kid till age 39 or 30 he was passively smoking because of his parents.  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  They have 3 dogs but no birds nor down jackets other than the old down jacket that is in the closet he is never used it.  There is no feather pillow for the sofa.  There is no history of seasonal allergies.  He is worried about having ILD or lung cancer. He is on fish oil but does not have acid reflux.    FeNO - 33pb         has a past medical history of Anxiety and depression, Benign positional vertigo, Cataract, History of chicken pox, and Mitral valve prolapse.   reports that he has never smoked. He has never used smokeless tobacco.  Past Surgical History:  Procedure Laterality Date   TONSILECTOMY, ADENOIDECTOMY, BILATERAL MYRINGOTOMY AND TUBES  Allergies  Allergen Reactions   Sulfa Antibiotics Nausea And Vomiting    Immunization History  Administered Date(s) Administered   Influenza,inj,Quad PF,6+ Mos 05/24/2013, 07/29/2015   Influenza-Unspecified 08/13/2016, 06/17/2017   PFIZER(Purple Top)SARS-COV-2 Vaccination 08/28/2019, 09/22/2019   Pneumococcal Conjugate-13 01/24/2019   Tdap 05/24/2013    Family History  Problem Relation Age of Onset   Heart disease Father 32       CHF; smoking/tobacco   Diverticulitis Brother    Mental illness Brother    Heart disease Paternal Grandfather    Abnormal EKG Paternal Grandfather    Osteoporosis Mother    Mental illness Brother    Cancer Maternal Grandmother    Hypertension Paternal Grandmother    Lung cancer Maternal  Grandfather      Current Outpatient Medications:    Ascorbic Acid (VITAMIN C) 1000 MG tablet, Take 1,000 mg by mouth daily., Disp: , Rfl:    fish oil-omega-3 fatty acids 1000 MG capsule, Take 2 g by mouth daily., Disp: , Rfl:    Magnesium 400 MG TABS, Take by mouth daily., Disp: , Rfl:    Multiple Vitamin (MULTI-VITAMINS) TABS, Take by mouth., Disp: , Rfl:    vitamin E (VITAMIN E) 400 UNIT capsule, Take 400 Units by mouth daily., Disp: , Rfl:    aspirin 81 MG tablet, Take 81 mg by mouth daily. (Patient not taking: Reported on 03/20/2020), Disp: , Rfl:    FLUZONE QUADRIVALENT 0.5 ML injection, ADM 0.5ML IM UTD (Patient not taking: No sig reported), Disp: , Rfl: 0   lidocaine (LIDODERM) 5 %, Place 1 patch onto the skin daily. Remove & Discard patch within 12 hours or as directed by MD (Patient not taking: Reported on 03/20/2020), Disp: 15 patch, Rfl: 0   Magnesium Carbonate (MAGNESIUM GLUCONATE) '54mg'$ /61m syringe, Take by mouth. (Patient not taking: Reported on 03/20/2020), Disp: , Rfl:    sildenafil (VIAGRA) 100 MG tablet, Take 0.5-1 tablets (50-100 mg total) by mouth daily as needed for erectile dysfunction. (Patient not taking: Reported on 10/01/2022), Disp: 5 tablet, Rfl: 11   St Johns Wort 1000 MG CAPS, Take by mouth. (Patient not taking: Reported on 03/20/2020), Disp: , Rfl:    tamsulosin (FLOMAX) 0.4 MG CAPS capsule, Take 1 capsule (0.4 mg total) by mouth daily. (Patient not taking: Reported on 10/01/2022), Disp: 30 capsule, Rfl: 3   zoster vaccine live, PF, (ZOSTAVAX) [id number]/0.65ML injection, Inject 19,400 Units into the skin once. (Patient not taking: Reported on 10/01/2022), Disp: 1 each, Rfl: 0      Objective:   Vitals:   10/01/22 1542  BP: 112/64  Pulse: 77  Temp: 98 F (36.7 C)  TempSrc: Oral  SpO2: 96%  Weight: 155 lb 6.4 oz (70.5 kg)  Height: '6\' 1"'$  (1.854 m)    Estimated body mass index is 20.5 kg/m as calculated from the following:   Height as of this encounter: '6\' 1"'$  (1.854  m).   Weight as of this encounter: 155 lb 6.4 oz (70.5 kg).  '@WEIGHTCHANGE'$ @  [employer]  10/01/22 1542  Weight: 155 lb 6.4 oz (70.5 kg)     Physical Exam    General: No distress. Looks well.  Neuro: Alert and Oriented x 3. GCS 15. Speech normal Psych: Pleasant Resp:  Barrel Chest - no.  Wheeze - no, Crackles - no, No overt respiratory distress but does cough periodically with laryngeal quality CVS: Normal heart sounds. Murmurs - no Ext: Stigmata of Connective Tissue Disease - no HEENT: Normal  upper airway. PEERL +. No post nasal drip        Assessment:       ICD-10-CM   1. Chronic cough  R05.3     2. H/O recurrent pneumonia  Z87.01     3. At risk from passive smoking  Z91.89          Plan:     Patient Instructions     ICD-10-CM   1. Chronic cough  R05.3     2. H/O recurrent pneumonia  Z87.01     3. At risk from passive smoking  Z91.89       Need to sort out cause of chronic cough and relatively strong pneumonia/bronchitis episodes that  Feno test is in grey zone for asthma  Plan - take a pic of your recent CXR and upload it in care everywhere - STOP FISH OIL - check blood IgG, IgA, IgM and IgE 10/01/2022 - check blood Quantiferon gold 10/01/2022 - check RAST allergy panel - do full PFT in 4 weeks  - do HRCT  supine and prone in 4 weeks  Followup  - 4-6 weeks but after above testing       SIGNATURE    Dr. Brand Males, M.D., F.C.C.P,  Pulmonary and Critical Care Medicine Staff Physician, [place] Director - Interstitial Lung Disease  Program  Pulmonary [place] at [place], [place], [address]  Pager: [phone], If no answer or between  15:00h - 7:00h: call [phone] Telephone: [phone]  4:30 PM 10/01/2022

## 2022-10-01 NOTE — Patient Instructions (Addendum)
ICD-10-CM   1. Chronic cough  R05.3     2. H/O recurrent pneumonia  Z87.01     3. At risk from passive smoking  Z91.89       Need to sort out cause of chronic cough and relatively strong pneumonia/bronchitis episodes that  Feno test is in grey zone for asthma  Plan - take a pic of your recent CXR and upload it in care everywhere - STOP FISH OIL - check blood IgG, IgA, IgM and IgE 10/01/2022 - check blood Quantiferon gold 10/01/2022 - check RAST allergy panel - do full PFT in 4 weeks  - do HRCT  supine and prone in 4 weeks  Followup  - 4-6 weeks but after above testing

## 2022-10-03 LAB — IGA: Immunoglobulin A: 144 mg/dL (ref 70–320)

## 2022-10-03 LAB — QUANTIFERON-TB GOLD PLUS
Mitogen-NIL: >10 IU/mL
NIL: 0.02 IU/mL
QuantiFERON-TB Gold Plus: NEGATIVE
TB1-NIL: 0 IU/mL
TB2-NIL: 0 IU/mL

## 2022-10-03 LAB — IGM: IgM, Serum: 69 mg/dL (ref 50–300)

## 2022-10-03 LAB — IGE: IgE (Immunoglobulin E), Serum: 3 kU/L (ref <=114)

## 2022-10-06 LAB — ALLERGEN PROFILE, PERENNIAL ALLERGEN IGE

## 2022-10-15 ENCOUNTER — Encounter: Payer: Self-pay | Admitting: Internal Medicine

## 2022-10-15 NOTE — Telephone Encounter (Signed)
Please see x-rays attached to mychart message from patient.  Thank you.

## 2022-10-20 NOTE — Telephone Encounter (Signed)
Reviewed. Image quality not great but enough to say there is hyperinfilation and possible inifltrates. Awa t CT chest. Allergy panela nd Immunoglobluin nomral

## 2022-10-30 ENCOUNTER — Institutional Professional Consult (permissible substitution): Payer: Medicare HMO | Admitting: Internal Medicine

## 2022-10-30 ENCOUNTER — Ambulatory Visit
Admission: RE | Admit: 2022-10-30 | Discharge: 2022-10-30 | Disposition: A | Discharge status: Home / Self Care | Payer: Medicare HMO | Source: Ambulatory Visit | Attending: Internal Medicine | Admitting: Internal Medicine

## 2022-10-30 DIAGNOSIS — R918 Other nonspecific abnormal finding of lung field: Secondary | ICD-10-CM | POA: Diagnosis not present

## 2022-10-30 DIAGNOSIS — I700 Atherosclerosis of aorta: Secondary | ICD-10-CM | POA: Diagnosis not present

## 2022-10-30 DIAGNOSIS — R053 Chronic cough: Secondary | ICD-10-CM

## 2022-10-30 DIAGNOSIS — R590 Localized enlarged lymph nodes: Secondary | ICD-10-CM | POA: Diagnosis not present

## 2022-10-30 DIAGNOSIS — J479 Bronchiectasis, uncomplicated: Secondary | ICD-10-CM | POA: Diagnosis not present

## 2022-11-02 ENCOUNTER — Ambulatory Visit (INDEPENDENT_AMBULATORY_CARE_PROVIDER_SITE_OTHER): Payer: Medicare HMO | Admitting: Internal Medicine

## 2022-11-02 DIAGNOSIS — R053 Chronic cough: Secondary | ICD-10-CM

## 2022-11-02 LAB — PULMONARY FUNCTION TEST
DL/VA % pred: 93 %
DL/VA: 3.77 ml/min/mmHg/L
DLCO cor % pred: 74 %
DLCO cor: 21.48 ml/min/mmHg
DLCO unc % pred: 74 %
DLCO unc: 21.48 ml/min/mmHg
FEF 25-75 Post: 4.5 L/sec
FEF 25-75 Pre: 0.67 L/sec
FEF2575-%Change-Post: 577 %
FEF2575-%Pred-Post: 157 %
FEF2575-%Pred-Pre: 23 %
FEV1-%Change-Post: 91 %
FEV1-%Pred-Post: 91 %
FEV1-%Pred-Pre: 47 %
FEV1-Post: 3.41 L
FEV1-Pre: 1.78 L
FEV1FVC-%Change-Post: 65 %
FEV1FVC-%Pred-Pre: 64 %
FEV6-%Change-Post: 20 %
FEV6-%Pred-Post: 90 %
FEV6-%Pred-Pre: 75 %
FEV6-Post: 4.3 L
FEV6-Pre: 3.58 L
FEV6FVC-%Change-Post: 4 %
FEV6FVC-%Pred-Post: 105 %
FEV6FVC-%Pred-Pre: 100 %
FVC-%Change-Post: 15 %
FVC-%Pred-Post: 85 %
FVC-%Pred-Pre: 74 %
FVC-Post: 4.31 L
FVC-Pre: 3.73 L
Post FEV1/FVC ratio: 79 %
Post FEV6/FVC ratio: 100 %
Pre FEV1/FVC ratio: 48 %
Pre FEV6/FVC Ratio: 96 %
RV % pred: 79 %
RV: 2.05 L
TLC % pred: 80 %
TLC: 6.14 L

## 2022-11-02 NOTE — Patient Instructions (Signed)
Full PFT performed today. °

## 2022-11-02 NOTE — Progress Notes (Signed)
Full PFT performed today. °

## 2022-11-03 ENCOUNTER — Ambulatory Visit: Payer: Medicare HMO | Admitting: Internal Medicine

## 2022-11-03 ENCOUNTER — Encounter: Payer: Self-pay | Admitting: Internal Medicine

## 2022-11-03 VITALS — BP 110/70 | HR 72 | Temp 97.8°F | Ht 73.0 in | Wt 154.8 lb

## 2022-11-03 DIAGNOSIS — J479 Bronchiectasis, uncomplicated: Secondary | ICD-10-CM

## 2022-11-03 DIAGNOSIS — Z8701 Personal history of pneumonia (recurrent): Secondary | ICD-10-CM | POA: Diagnosis not present

## 2022-11-03 DIAGNOSIS — R053 Chronic cough: Secondary | ICD-10-CM

## 2022-11-03 DIAGNOSIS — R918 Other nonspecific abnormal finding of lung field: Secondary | ICD-10-CM

## 2022-11-03 DIAGNOSIS — R911 Solitary pulmonary nodule: Secondary | ICD-10-CM | POA: Diagnosis not present

## 2022-11-03 NOTE — Progress Notes (Signed)
OV 10/01/2022  Subjective:  Patient ID: Joseph Saunders, male , DOB: 1956-03-05 , age 67 y.o. , MRN: [id number] , ADDRESS: [address] [place] [address] PCP Georgina Quint, MD Patient Care Team: Georgina Quint, MD as PCP - General (Internal Medicine)  This Provider for this visit: Treatment Team:  Attending Provider: Kalman Shan, MD    10/01/2022 -   Chief Complaint  Patient presents with   Consult    Abn CXR. Recurring pneumonias x 2 years.  C/o cough with exertion or stress x 6 months.     HPI Joseph Saunders 67 y.o. -presents with his wife.  He and his wife are good friends with [employer] family [I took care of their dad who had IPF].  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  He tells me that a year ago in 2023 Easter he had severe symptoms of cough and green sputum and was diagnosed to have pneumonia on a chest x-ray by primary care physician.  He also lost significant amount of weight.  He was given Levaquin and then he improved.  I do not have the chest x-ray for my visualization.  And then sometime in the summer 2023 started developing insidious onset of chronic cough.  It was a dry cough.  It presented when he would take a deep breath.  Also presented would get worse when he tried to move particular to the right lateral side.  It would wake him up at night.  The cough did get better with cough drops.  Also lying in a semirecumbent position made the cough better but there is no wheezing or shortness of breath.  In the backdrop of this approximately in September 06, 2022 he picked up fever cough and green sputum after being exposed to granddaughter who was sick.  Wife also got similar illness but wife and granddaughter recovered quickly but he got significantly worse with weight loss.  He is a chest x-ray again showed pneumonia but this time in a different location [again I do not have these images for my  visualization] but did confirm this history but review of the external records at primary care office.  He was then given Levaquin.  After the Levaquin is gained his weight back his cough is improved his energy level is improved but is still far away from his baseline cough and his energy levels are not fully back at baseline.  He drinks socially a few drinks a week.  When he was a kid till age 26 or 54 he was passively smoking because of his parents.  Also at age 66 he did some sandblasting briefly.  They have 3 dogs but no birds nor down jackets other than the old down jacket that is in the closet he is never used it.  There is no feather pillow for the sofa.  There is no history of seasonal allergies.  He is worried about having ILD or lung cancer. He is on fish oil but does not have acid reflux.    FeNO - 33pb   OV 11/03/2022  Subjective:  Patient ID: Joseph Saunders, male , DOB: 02-23-1956 , age 69 y.o. , MRN: [id number] , ADDRESS: [address] [place] [address] PCP Georgina Quint, MD Patient Care Team: Georgina Quint, MD as PCP - General (Internal Medicine)  This Provider for this visit: Treatment Team:  Attending Provider: Kalman Shan, MD  11/03/2022 -   Chief Complaint  Patient presents with   Follow-up    Discuss PFT 11/02/2022 and CT 10/30/22.  Cough much improved.  More energy.     HPI Joseph Saunders 67 y.o. - returns for follow-up of his chronic cough.  He had immunoglobulin profile this was normal.  RAST allergy panel RAST allergy panel was normal.  He had CT scan of the chest today and it shows groundglass opacities in the upper lobe and lingula and some in the lower lobe.  He has some still recall bronchiectasis with tree-in-bud nodularity.  He also has pulmonary nodules with the largest being 0.8 cm in the lingula.  Overall suspicion is MAI.  However he is feeling better and his cough is nearly gone his energy levels have  almost back to baseline.  He is mowing the yard and raking leaves but he is not masking.  We went over these findings.  I shared the images with him and his wife who is here with him.  She is an independent historian today.  We took a shared decision making to closely monitor the situation as opposed to doing a bronchoscopy right now.    CT Chest data  CT Chest High Resolution  Result Date: 11/03/2022 CLINICAL DATA:  Chronic cough, recurrent pneumonia, childhood secondhand smoke exposure. EXAM: CT CHEST WITHOUT CONTRAST TECHNIQUE: Multidetector CT imaging of the chest was performed following the standard protocol without intravenous contrast. High resolution imaging of the lungs, as well as inspiratory and expiratory imaging, was performed. RADIATION DOSE REDUCTION: This exam was performed according to the departmental dose-optimization program which includes automated exposure control, adjustment of the mA and/or kV according to patient size and/or use of iterative reconstruction technique. COMPARISON:  None Available. FINDINGS: Cardiovascular: Normal heart size. No significant pericardial effusion/thickening. Mildly atherosclerotic nonaneurysmal thoracic aorta. Normal caliber pulmonary arteries. Mediastinum/Nodes: No significant thyroid nodules. Unremarkable esophagus. No pathologically enlarged axillary, mediastinal or hilar lymph nodes, noting limited sensitivity for the detection of hilar adenopathy on this noncontrast study. Lungs/Pleura: No pneumothorax. No pleural effusion. Scattered regions of mild-to-moderate cylindrical bronchiectasis in both lungs, predominantly in the medial right middle lobe and inferior lingula with associated patchy mild-to-moderate tree-in-bud opacities, ill-defined centrilobular nodularity and bandlike subpleural foci of consolidation and volume loss at the areas of bronchiectasis. Representative 0.8 cm lingular solid nodule (series 7/image 109). Solid 0.6 cm anterior right  middle lobe nodule along the minor fissure (series 7/image 96). Patchy ground-glass opacity and minimal wispy curvilinear consolidation throughout the dependent lungs, most prominent in the posterior upper lobes, right greater than left. No significant regions of subpleural reticulation or frank honeycombing. No significant lobular air trapping or evidence of tracheobronchomalacia on the expiration sequence. Upper abdomen: No acute abnormality. Musculoskeletal: No aggressive appearing focal osseous lesions. Minimal thoracic spondylosis. IMPRESSION: 1. 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 Widespread patchy dependent ground-glass opacity with wispy curvilinear consolidation, favor resolving multilobar pneumonia. 2. Solid pulmonary nodules measuring up to 0.8 cm in the lingula, probably due to MAI. Per Fleischner Society Guidelines, recommend a non-contrast Chest CT at 3-6 months, then another non-contrast Chest CT at 18-24 months. These guidelines do not apply to immunocompromised patients and patients with cancer. Follow up in patients with significant comorbidities as clinically warranted. For lung cancer screening, adhere to Lung-RADS guidelines. Reference: Radiology. 2017; 284(1):228-43. 3.  Aortic  Atherosclerosis (ICD10-I70.0). Electronically Signed   By: Delbert Phenix M.D.   On: 11/03/2022 09:46      PFT     Latest Ref Rng & Units 11/02/2022    3:49 PM  PFT Results  FVC-Pre L 3.73  P  FVC-Predicted Pre % 74  P  FVC-Post L 4.31  P  FVC-Predicted Post % 85  P  Pre FEV1/FVC % % 48  P  Post FEV1/FCV % % 79  P  FEV1-Pre L 1.78  P  FEV1-Predicted Pre % 47  P  FEV1-Post L 3.41  P  DLCO uncorrected ml/min/mmHg 21.48  P  DLCO UNC% % 74  P  DLCO corrected ml/min/mmHg  21.48  P  DLCO COR %Predicted % 74  P  DLVA Predicted % 93  P  TLC L 6.14  P  TLC % Predicted % 80  P  RV % Predicted % 79  P    P Preliminary result     Latest Reference Range & Units 10/01/22 16:42  Class Description Allergens  Comment  D Pteronyssinus IgE Class 0 kU/L <0.10  D Farinae IgE Class 0 kU/L <0.10  Cat Dander IgE Class 0 kU/L <0.10  Dog Dander IgE Class 0 kU/L <0.10  Penicillium Chrysogen IgE Class 0 kU/L <0.10  Cladosporium Herbarum IgE Class 0 kU/L <0.10  Aspergillus Fumigatus IgE Class 0 kU/L <0.10  Mucor Racemosus IgE Class 0 kU/L <0.10  Alternaria Alternata IgE Class 0 kU/L <0.10  Stemphylium Herbarum IgE Class 0 kU/L <0.10  Goose Feathers IgE Class 0 kU/L <0.10  Chicken Feathers IgE Class 0 kU/L <0.10  Duck Feathers IgE Class 0 kU/L <0.10  IgE (Immunoglobulin E), Serum <OR=114 kU/L 3  Mouse Urine IgE Class 0 kU/L <0.10  Immunoglobulin A 70 - 320 mg/dL 119     has a past medical history of Anxiety and depression, Benign positional vertigo, Cataract, History of chicken pox, and Mitral valve prolapse.   reports that he has never smoked. He has been exposed to tobacco smoke. He has never used smokeless tobacco.  Past Surgical History:  Procedure Laterality Date   TONSILECTOMY, ADENOIDECTOMY, BILATERAL MYRINGOTOMY AND TUBES      Allergies  Allergen Reactions   Sulfa Antibiotics Nausea And Vomiting    Immunization History  Administered Date(s) Administered   Influenza,inj,Quad PF,6+ Mos 05/24/2013, 07/29/2015   Influenza-Unspecified 08/13/2016, 06/17/2017   PFIZER(Purple Top)SARS-COV-2 Vaccination 08/28/2019, 09/22/2019   Pneumococcal Conjugate-13 01/24/2019   Tdap 05/24/2013    Family History  Problem Relation Age of Onset   Heart disease Father 69       CHF; smoking/tobacco   Diverticulitis Brother    Mental illness Brother    Heart disease Paternal Grandfather    Abnormal EKG Paternal Grandfather    Osteoporosis Mother    Mental illness  Brother    Cancer Maternal Grandmother    Hypertension Paternal Grandmother    Lung cancer Maternal Grandfather      Current Outpatient Medications:    Ascorbic Acid (VITAMIN C) 1000 MG tablet, Take 1,000 mg by mouth daily., Disp: , Rfl:    Magnesium 400 MG TABS, Take by mouth daily., Disp: , Rfl:    Multiple Vitamin (MULTI-VITAMINS) TABS, Take by mouth., Disp: , Rfl:    vitamin E (VITAMIN E) 400 UNIT capsule, Take 400 Units by mouth daily., Disp: , Rfl:    aspirin 81 MG tablet, Take 81 mg by mouth daily., Disp: , Rfl:    fish oil-omega-3  fatty acids 1000 MG capsule, Take 2 g by mouth daily. (Patient not taking: Reported on 11/03/2022), Disp: , Rfl:    FLUZONE QUADRIVALENT 0.5 ML injection, ADM 0.5ML IM UTD (Patient not taking: No sig reported), Disp: , Rfl: 0   lidocaine (LIDODERM) 5 %, Place 1 patch onto the skin daily. Remove & Discard patch within 12 hours or as directed by MD (Patient not taking: Reported on 03/20/2020), Disp: 15 patch, Rfl: 0   Magnesium Carbonate (MAGNESIUM GLUCONATE) 54mg /9ml syringe, Take by mouth. (Patient not taking: Reported on 03/20/2020), Disp: , Rfl:    sildenafil (VIAGRA) 100 MG tablet, Take 0.5-1 tablets (50-100 mg total) by mouth daily as needed for erectile dysfunction. (Patient not taking: Reported on 10/01/2022), Disp: 5 tablet, Rfl: 11   St Johns Wort 1000 MG CAPS, Take by mouth. (Patient not taking: Reported on 03/20/2020), Disp: , Rfl:    tamsulosin (FLOMAX) 0.4 MG CAPS capsule, Take 1 capsule (0.4 mg total) by mouth daily. (Patient not taking: Reported on 10/01/2022), Disp: 30 capsule, Rfl: 3   zoster vaccine live, PF, (ZOSTAVAX) 95638 UNT/0.65ML injection, Inject 19,400 Units into the skin once. (Patient not taking: Reported on 10/01/2022), Disp: 1 each, Rfl: 0      Objective:   Vitals:   11/03/22 1357  BP: 110/70  Pulse: 72  Temp: 97.8 F (36.6 C)  TempSrc: Oral  SpO2: 98%  Weight: 154 lb 12.8 oz (70.2 kg)  Height: 6\' 1"  (1.854 m)    Estimated  body mass index is 20.42 kg/m as calculated from the following:   Height as of this encounter: 6\' 1"  (1.854 m).   Weight as of this encounter: 154 lb 12.8 oz (70.2 kg).  @WEIGHTCHANGE @  [employer]   11/03/22 1357  Weight: 154 lb 12.8 oz (70.2 kg)     Physical Exam    General: No distress. Looks wel Neuro: Alert and Oriented x 3. GCS 15. Speech normal Psych: Pleasant Resp:  Barrel Chest - no.  Wheeze - no, Crackles - no, No overt respiratory distress CVS: Normal heart sounds. Murmurs - no Ext: Stigmata of Connective Tissue Disease - no HEENT: Normal upper airway. PEERL +. No post nasal drip        Assessment:       ICD-10-CM   1. Chronic cough  R05.3     2. H/O recurrent pneumonia  Z87.01     3. Ground glass opacity present on imaging of lung  R91.8     4. Incidental lung nodule, greater than or equal to 8mm  R91.1     5. Cylindrical bronchiectasis  J47.9          Plan:     Patient Instructions     ICD-10-CM   1. Chronic cough  R05.3     2. H/O recurrent pneumonia  Z87.01     3. Ground glass opacity present on imaging of lung  R91.8     4. Incidental lung nodule, greater than or equal to 8mm  R91.1     5. Cylindrical bronchiectasis  J47.9       Suspect 1 of your pneumonia episodes left ear with some bronchiectasis that then makes it more susceptible to get recurrent respiratory infections that are more pronounced.  In addition you might be colonized by a low-grade noncontagious infection called Mycobacterium Avium Complex (MAC)  Given the fact your cough is nearly resolved and you are more energetic and you are feeling better I recommend expectant course  as opposed to doing a bronchoscopy right now.    Plan -Do CT scan of the chest without contrast in 3 months -Wear a mask when you are out with clusters of people who might be sick or when you are doing yard work -Continue monitor your symptoms; report it was if you are getting  worse  Followup  - 3 months but after CT scan of the chest; 15-minute visit   (Level 04: Estb 30-39 min    visit spent in total care time and counseling or/and coordination of care by this undersigned MD - Dr Kalman Shan. This includes one or more of the following on this same day 11/03/2022: pre-charting, chart review, note writing, documentation discussion of test results, diagnostic or treatment recommendations, prognosis, risks and benefits of management options, instructions, education, compliance or risk-factor reduction. It excludes time spent by the CMA or office staff in the care of the patient . Actual time is 30 min)    SIGNATURE    Dr. Kalman Shan, M.D., F.C.C.P,  Pulmonary and Critical Care Medicine Staff Physician, [facility] Center Director - Interstitial Lung Disease  Program  Pulmonary Fibrosis [facility] at [facility] [place], [place], [address]  Pager: [phone], If no answer or between  15:00h - 7:00h: call [phone] Telephone: [phone]  2:23 PM 11/03/2022

## 2022-11-03 NOTE — Patient Instructions (Addendum)
ICD-10-CM   1. Chronic cough  R05.3     2. H/O recurrent pneumonia  Z87.01     3. Ground glass opacity present on imaging of lung  R91.8     4. Incidental lung nodule, greater than or equal to 8mm  R91.1     5. Cylindrical bronchiectasis  J47.9       Suspect 1 of your pneumonia episodes left ear with some bronchiectasis that then makes it more susceptible to get recurrent respiratory infections that are more pronounced.  In addition you might be colonized by a low-grade noncontagious infection called Mycobacterium Avium Complex (MAC)  Given the fact your cough is nearly resolved and you are more energetic and you are feeling better I recommend expectant course as opposed to doing a bronchoscopy right now.    Plan -Do CT scan of the chest without contrast in 3 months -Wear a mask when you are out with clusters of people who might be sick or when you are doing yard work -Continue monitor your symptoms; report it was if you are getting worse  Followup  - 3 months but after CT scan of the chest; 15-minute visit

## 2022-11-03 NOTE — Addendum Note (Signed)
Addended by: Hedda Slade on: 11/03/2022 02:40 PM   Modules accepted: Orders

## 2023-01-07 ENCOUNTER — Encounter: Payer: Self-pay | Admitting: Internal Medicine

## 2023-01-07 NOTE — Telephone Encounter (Signed)
If he is worried about radiation risk, then can postpone CT by 2 more months but still ideal is the get CT chest wo contrast . I am willing to se what LDCT shows and if he is keen can work with that

## 2023-01-07 NOTE — Telephone Encounter (Signed)
MR, the pt is asking if the ct chest coming up could be changed to a low dose. Please advise, thanks!

## 2023-01-13 DIAGNOSIS — W1830XA Fall on same level, unspecified, initial encounter: Secondary | ICD-10-CM | POA: Diagnosis not present

## 2023-01-13 DIAGNOSIS — S61412A Laceration without foreign body of left hand, initial encounter: Secondary | ICD-10-CM | POA: Diagnosis not present

## 2023-01-13 DIAGNOSIS — S60222A Contusion of left hand, initial encounter: Secondary | ICD-10-CM | POA: Diagnosis not present

## 2023-01-26 DIAGNOSIS — K4090 Unilateral inguinal hernia, without obstruction or gangrene, not specified as recurrent: Secondary | ICD-10-CM | POA: Diagnosis not present

## 2023-01-26 DIAGNOSIS — R1031 Right lower quadrant pain: Secondary | ICD-10-CM | POA: Diagnosis not present

## 2023-02-01 ENCOUNTER — Ambulatory Visit (HOSPITAL_COMMUNITY): Payer: No Typology Code available for payment source

## 2023-02-18 DIAGNOSIS — Z961 Presence of intraocular lens: Secondary | ICD-10-CM | POA: Diagnosis not present

## 2023-02-18 DIAGNOSIS — H524 Presbyopia: Secondary | ICD-10-CM | POA: Diagnosis not present

## 2023-02-18 DIAGNOSIS — H52223 Regular astigmatism, bilateral: Secondary | ICD-10-CM | POA: Diagnosis not present

## 2023-02-18 DIAGNOSIS — H0100B Unspecified blepharitis left eye, upper and lower eyelids: Secondary | ICD-10-CM | POA: Diagnosis not present

## 2023-02-18 DIAGNOSIS — H0100A Unspecified blepharitis right eye, upper and lower eyelids: Secondary | ICD-10-CM | POA: Diagnosis not present

## 2023-03-19 ENCOUNTER — Ambulatory Visit: Payer: Self-pay | Admitting: General Surgery

## 2023-03-19 DIAGNOSIS — K4090 Unilateral inguinal hernia, without obstruction or gangrene, not specified as recurrent: Secondary | ICD-10-CM | POA: Diagnosis not present

## 2023-03-19 MED ORDER — KETOROLAC TROMETHAMINE 15 MG/ML IJ SOLN: 15.0000 mg | Freq: Once | INTRAMUSCULAR | Status: AC

## 2023-03-24 ENCOUNTER — Telehealth: Payer: Self-pay | Admitting: Internal Medicine

## 2023-03-24 NOTE — Telephone Encounter (Signed)
Patient's wife called and states patient is ready to get low dose CT scan, per MR's notes, "If he is worried about radiation risk, then can postpone CT by 2 more months but still ideal is to get CT chest wo contrast . I am willing to see what LDCT shows and if he can work with that".  Please advise on scheduling and call patient back with appointment

## 2023-04-08 DIAGNOSIS — L570 Actinic keratosis: Secondary | ICD-10-CM | POA: Diagnosis not present

## 2023-04-08 DIAGNOSIS — D225 Melanocytic nevi of trunk: Secondary | ICD-10-CM | POA: Diagnosis not present

## 2023-04-08 DIAGNOSIS — L814 Other melanin hyperpigmentation: Secondary | ICD-10-CM | POA: Diagnosis not present

## 2023-04-08 DIAGNOSIS — L820 Inflamed seborrheic keratosis: Secondary | ICD-10-CM | POA: Diagnosis not present

## 2023-04-08 DIAGNOSIS — D2262 Melanocytic nevi of left upper limb, including shoulder: Secondary | ICD-10-CM | POA: Diagnosis not present

## 2023-04-08 DIAGNOSIS — Z85828 Personal history of other malignant neoplasm of skin: Secondary | ICD-10-CM | POA: Diagnosis not present

## 2023-04-08 DIAGNOSIS — L821 Other seborrheic keratosis: Secondary | ICD-10-CM | POA: Diagnosis not present

## 2023-04-08 DIAGNOSIS — D2271 Melanocytic nevi of right lower limb, including hip: Secondary | ICD-10-CM | POA: Diagnosis not present

## 2023-04-14 NOTE — Telephone Encounter (Signed)
Please call wife to sched CT. Thanks.

## 2023-04-14 NOTE — Telephone Encounter (Signed)
If this needs to be a LDCT we will need a order placed

## 2023-04-14 NOTE — Telephone Encounter (Signed)
Her # is [phone]

## 2023-04-14 NOTE — Telephone Encounter (Signed)
PT wife calling again. No call back since her first call on 9/4. Please call to sched and advise. Thanks.

## 2023-04-15 NOTE — Telephone Encounter (Signed)
LMTCB Asked whether they decided to do LDCT scan or CT without contrast.

## 2023-04-19 ENCOUNTER — Ambulatory Visit (HOSPITAL_BASED_OUTPATIENT_CLINIC_OR_DEPARTMENT_OTHER): Admit: 2023-04-19 | Payer: Medicare HMO | Admitting: General Surgery

## 2023-04-19 ENCOUNTER — Encounter (HOSPITAL_BASED_OUTPATIENT_CLINIC_OR_DEPARTMENT_OTHER): Payer: Self-pay

## 2023-04-19 SURGERY — REPAIR, HERNIA, INGUINAL, ADULT
Anesthesia: General | Laterality: Right

## 2023-04-20 DIAGNOSIS — Z0000 Encounter for general adult medical examination without abnormal findings: Secondary | ICD-10-CM | POA: Diagnosis not present

## 2023-04-20 DIAGNOSIS — Z1331 Encounter for screening for depression: Secondary | ICD-10-CM | POA: Diagnosis not present

## 2023-04-20 DIAGNOSIS — N529 Male erectile dysfunction, unspecified: Secondary | ICD-10-CM | POA: Diagnosis not present

## 2023-04-20 DIAGNOSIS — R3911 Hesitancy of micturition: Secondary | ICD-10-CM | POA: Diagnosis not present

## 2023-04-20 DIAGNOSIS — Z23 Encounter for immunization: Secondary | ICD-10-CM | POA: Diagnosis not present

## 2023-04-20 DIAGNOSIS — Z1339 Encounter for screening examination for other mental health and behavioral disorders: Secondary | ICD-10-CM | POA: Diagnosis not present

## 2023-05-06 ENCOUNTER — Telehealth: Payer: Self-pay | Admitting: Internal Medicine

## 2023-05-06 DIAGNOSIS — R911 Solitary pulmonary nodule: Secondary | ICD-10-CM

## 2023-05-06 DIAGNOSIS — R053 Chronic cough: Secondary | ICD-10-CM

## 2023-05-06 NOTE — Telephone Encounter (Signed)
Diane states patient needs CT scan. Diane phone number is [phone].

## 2023-05-14 NOTE — Telephone Encounter (Signed)
I do not see an order for a low dose ct

## 2023-05-14 NOTE — Telephone Encounter (Signed)
Patient's wife is requesting a low dose CT scan for her husband.

## 2023-05-17 NOTE — Telephone Encounter (Signed)
Based on note from 01/07/23 I have ordered LDCT  f he is worried about radiation risk, then can postpone CT by 2 more months but still ideal is the get CT chest wo contrast . I am willing to se what LDCT shows and if he is keen can work with that

## 2023-05-18 ENCOUNTER — Telehealth: Payer: Self-pay | Admitting: Internal Medicine

## 2023-05-22 NOTE — Telephone Encounter (Signed)
Dr. Marchelle Gearing, please advise on new order for CT.

## 2023-05-24 NOTE — Telephone Encounter (Signed)
If you look at my note from 11/03/2022 the diagnosis chronic cough history of recurrent pneumonia and 8 mm nodule along with groundglass opacities.  Actually asked for a CT scan without contrast in 3 months.  I did not ask for a low-dose CT

## 2023-05-26 NOTE — Telephone Encounter (Signed)
Looks like this is already scheduled for 06/03/2023

## 2023-05-27 DIAGNOSIS — R69 Illness, unspecified: Secondary | ICD-10-CM | POA: Diagnosis not present

## 2023-05-28 ENCOUNTER — Encounter: Payer: Self-pay | Admitting: Internal Medicine

## 2023-06-03 ENCOUNTER — Other Ambulatory Visit: Payer: Medicare HMO

## 2023-06-04 ENCOUNTER — Encounter: Payer: Self-pay | Admitting: Internal Medicine

## 2023-06-11 ENCOUNTER — Ambulatory Visit: Payer: Medicare HMO | Admitting: Internal Medicine

## 2023-06-18 DIAGNOSIS — R69 Illness, unspecified: Secondary | ICD-10-CM | POA: Diagnosis not present

## 2023-07-01 ENCOUNTER — Telehealth: Payer: Self-pay | Admitting: Pediatric Endocrinology

## 2023-07-01 NOTE — Telephone Encounter (Signed)
PT's wife calling. States they asked Dr. Elvera Lennox at last appt if they could do a low dose MRI rather than one with Contrast.   The CT center said they will not do a low dose MRI unless he is a smoker. PT seeks to have this over-ridden or can we send him to a different CT center.  Please call wife @ [phone]

## 2023-07-02 ENCOUNTER — Telehealth: Payer: Self-pay | Admitting: Internal Medicine

## 2023-07-08 DIAGNOSIS — R69 Illness, unspecified: Secondary | ICD-10-CM | POA: Diagnosis not present

## 2023-07-15 DIAGNOSIS — Z1152 Encounter for screening for COVID-19: Secondary | ICD-10-CM | POA: Diagnosis not present

## 2023-07-15 DIAGNOSIS — J069 Acute upper respiratory infection, unspecified: Secondary | ICD-10-CM | POA: Diagnosis not present

## 2023-07-15 DIAGNOSIS — R9389 Abnormal findings on diagnostic imaging of other specified body structures: Secondary | ICD-10-CM | POA: Diagnosis not present

## 2023-07-15 DIAGNOSIS — R051 Acute cough: Secondary | ICD-10-CM | POA: Diagnosis not present

## 2023-07-15 DIAGNOSIS — R509 Fever, unspecified: Secondary | ICD-10-CM | POA: Diagnosis not present

## 2023-07-15 DIAGNOSIS — J219 Acute bronchiolitis, unspecified: Secondary | ICD-10-CM | POA: Diagnosis not present

## 2023-07-15 DIAGNOSIS — R911 Solitary pulmonary nodule: Secondary | ICD-10-CM | POA: Diagnosis not present

## 2023-07-15 DIAGNOSIS — R110 Nausea: Secondary | ICD-10-CM | POA: Diagnosis not present

## 2023-07-27 ENCOUNTER — Encounter: Payer: Self-pay | Admitting: Internal Medicine

## 2023-07-28 ENCOUNTER — Encounter: Payer: Self-pay | Admitting: Internal Medicine

## 2023-07-30 NOTE — Telephone Encounter (Signed)
MR, please advise. Thanks!  

## 2023-08-02 NOTE — Telephone Encounter (Signed)
 Pt presented to the front desk to drop off CD of X Rays, I will put in Dr. Jane Canary box

## 2023-08-05 NOTE — Telephone Encounter (Signed)
Advised Pt that Dr. Marchelle Gearing does not recommend him getting a LDCT and he is fine with that. He is going to get his CT without contrast scheduled. Nothing further is needed

## 2023-08-20 ENCOUNTER — Other Ambulatory Visit: Payer: Medicare HMO

## 2023-08-25 ENCOUNTER — Telehealth: Payer: Self-pay | Admitting: Internal Medicine

## 2023-08-25 NOTE — Telephone Encounter (Signed)
Pt's updated Insurance information is on Epic and authorized. Pt has an appointment on the 10th at [facility].

## 2023-08-27 ENCOUNTER — Ambulatory Visit (HOSPITAL_COMMUNITY): Payer: Medicare HMO

## 2023-08-30 ENCOUNTER — Ambulatory Visit (HOSPITAL_BASED_OUTPATIENT_CLINIC_OR_DEPARTMENT_OTHER)
Admission: RE | Admit: 2023-08-30 | Discharge: 2023-08-30 | Disposition: A | Discharge status: Home / Self Care | Payer: Medicare Other | Source: Ambulatory Visit | Attending: Internal Medicine | Admitting: Internal Medicine

## 2023-08-30 DIAGNOSIS — R918 Other nonspecific abnormal finding of lung field: Secondary | ICD-10-CM | POA: Insufficient documentation

## 2023-09-01 ENCOUNTER — Encounter: Payer: Self-pay | Admitting: Internal Medicine

## 2023-09-01 ENCOUNTER — Ambulatory Visit: Payer: Medicare Other | Admitting: Internal Medicine

## 2023-09-01 VITALS — BP 112/60 | HR 70 | Ht 73.0 in | Wt 159.8 lb

## 2023-09-01 DIAGNOSIS — J479 Bronchiectasis, uncomplicated: Secondary | ICD-10-CM | POA: Diagnosis not present

## 2023-09-01 NOTE — Patient Instructions (Addendum)
    ICD-10-CM   1. Bronchiectasis without complication (HCC)  J47.9      -Formal report pending but in my visualization CT scan of the chest 08/30/2023 is much improved compared to spring 2024.  Your bilateral middle lobe and left lingula bronchiectasis.  Because of this is not known.  Current symptom burden is very mild.  Plan --Wear a mask when you are out with clusters of people who might be sick or when you are doing yard work -Continue monitor your symptoms; report it was if you are getting worse -Get flutter valve device from [employer] AND/OR look at [facility] for daily pulmonary hygiene -Do blood work for IgG, IgM, ANA, blood work for bronchiectasis. -Do spirometry and DLCO in 6 months -Consider bronchoscopy if symptoms or lung function get worse or having repeated flareups  Followup  - 6 months but after spirometry and DLCO 15-minute visit t; 15-minute visit

## 2023-09-01 NOTE — Progress Notes (Signed)
 OV 10/01/2022  Subjective:  Patient ID: Joseph Saunders, male , DOB: [birth year] , age 68 y.o. , MRN: [id number] , ADDRESS: [address] [place] [address] PCP Joseph Quint, MD Patient Care Team: Joseph Quint, MD as PCP - General (Internal Medicine)  This Provider for this visit: Treatment Team:  Attending Provider: Kalman Shan, MD    10/01/2022 -   Chief Complaint  Patient presents with   Consult    Abn CXR. Recurring pneumonias x 2 years.  C/o cough with exertion or stress x 6 months.     HPI Joseph Saunders 68 y.o. -presents with his wife.  He and his wife are good friends with [place] family [I took care of their dad who had IPF].  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  He tells me that a year ago in 2023 Easter he had severe symptoms of cough and green sputum and was diagnosed to have pneumonia on a chest x-ray by primary care physician.  He also lost significant amount of weight.  He was given Levaquin and then he improved.  I do not have the chest x-ray for my visualization.  And then sometime in the summer 2023 started developing insidious onset of chronic cough.  It was a dry cough.  It presented when he would take a deep breath.  Also presented would get worse when he tried to move particular to the right lateral side.  It would wake him up at night.  The cough did get better with cough drops.  Also lying in a semirecumbent position made the cough better but there is no wheezing or shortness of breath.  In the backdrop of this approximately in September 06, 2022 he picked up fever cough and green sputum after being exposed to granddaughter who was sick.  Wife also got similar illness but wife and granddaughter recovered quickly but he got significantly worse with weight loss.  He is a chest x-ray again showed pneumonia but this time in a different location [again I do not have these images for my  visualization] but did confirm this history but review of the external records at primary care office.  He was then given Levaquin.  After the Levaquin is gained his weight back his cough is improved his energy level is improved but is still far away from his baseline cough and his energy levels are not fully back at baseline.  He drinks socially a few drinks a week.  When he was a kid till age 83 or 41 he was passively smoking because of his parents.  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  They have 3 dogs but no birds nor down jackets other than the old down jacket that is in the closet he is never used it.  There is no feather pillow for the sofa.  There is no history of seasonal allergies.  He is worried about having ILD or lung cancer. He is on fish oil but does not have acid reflux.    FeNO - 33pb   OV 11/03/2022  Subjective:  Patient ID: Joseph Saunders, male , DOB: 06-04-1956 , age 89 y.o. , MRN: [id number] , ADDRESS: [address] [place] [address] PCP Joseph Quint, MD Patient Care Team: Joseph Quint, MD as PCP - General (Internal Medicine)  This Provider for this visit: Treatment Team:  Attending Provider: Kalman Shan, MD  11/03/2022 -   Chief Complaint  Patient presents with   Follow-up    Discuss PFT 11/02/2022 and CT 10/30/22.  Cough much improved.  More energy.     HPI Joseph Saunders 68 y.o. - returns for follow-up of his chronic cough.  He had immunoglobulin profile this was normal.  RAST allergy panel RAST allergy panel was normal.  He had CT scan of the chest today and it shows groundglass opacities in the upper lobe and lingula and some in the lower lobe.  He has some still recall bronchiectasis with tree-in-bud nodularity.  He also has pulmonary nodules with the largest being 0.8 cm in the lingula.  Overall suspicion is MAI.  However he is feeling better and his cough is nearly gone his energy levels have  almost back to baseline.  He is mowing the yard and raking leaves but he is not masking.  We went over these findings.  I shared the images with him and his wife who is here with him.  She is an independent historian today.  We took a shared decision making to closely monitor the situation as opposed to doing a bronchoscopy right now.    CT Chest data  CT Chest High Resolution  Result Date: 11/03/2022 CLINICAL DATA:  Chronic cough, recurrent pneumonia, childhood secondhand smoke exposure. EXAM: CT CHEST WITHOUT CONTRAST TECHNIQUE: Multidetector CT imaging of the chest was performed following the standard protocol without intravenous contrast. High resolution imaging of the lungs, as well as inspiratory and expiratory imaging, was performed. RADIATION DOSE REDUCTION: This exam was performed according to the departmental dose-optimization program which includes automated exposure control, adjustment of the mA and/or kV according to patient size and/or use of iterative reconstruction technique. COMPARISON:  None Available. FINDINGS: Cardiovascular: Normal heart size. No significant pericardial effusion/thickening. Mildly atherosclerotic nonaneurysmal thoracic aorta. Normal caliber pulmonary arteries. Mediastinum/Nodes: No significant thyroid nodules. Unremarkable esophagus. No pathologically enlarged axillary, mediastinal or hilar lymph nodes, noting limited sensitivity for the detection of hilar adenopathy on this noncontrast study. Lungs/Pleura: No pneumothorax. No pleural effusion. Scattered regions of mild-to-moderate cylindrical bronchiectasis in both lungs, predominantly in the medial right middle lobe and inferior lingula with associated patchy mild-to-moderate tree-in-bud opacities, ill-defined centrilobular nodularity and bandlike subpleural foci of consolidation and volume loss at the areas of bronchiectasis. Representative 0.8 cm lingular solid nodule (series 7/image 109). Solid 0.6 cm anterior right  middle lobe nodule along the minor fissure (series 7/image 96). Patchy ground-glass opacity and minimal wispy curvilinear consolidation throughout the dependent lungs, most prominent in the posterior upper lobes, right greater than left. No significant regions of subpleural reticulation or frank honeycombing. No significant lobular air trapping or evidence of tracheobronchomalacia on the expiration sequence. Upper abdomen: No acute abnormality. Musculoskeletal: No aggressive appearing focal osseous lesions. Minimal thoracic spondylosis. IMPRESSION: 1. 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 Widespread patchy dependent ground-glass opacity with wispy curvilinear consolidation, favor resolving multilobar pneumonia. 2. Solid pulmonary nodules measuring up to 0.8 cm in the lingula, probably due to MAI. /Per Fleischner Society Guidelines, recommend a non-contrast Chest CT at 3-6 months, then another non-contrast Chest CT at 18-24 months. These guidelines do not apply to immunocompromised patients and patients with cancer. Follow up in patients with significant comorbidities as clinically warranted. For lung cancer screening, adhere to Lung-RADS guidelines. Reference: Radiology. 2017; 284(1):228-43. 3.  Aortic  Atherosclerosis (ICD10-I70.0). Electronically Signed   By: Delbert Phenix M.D.   On: 11/03/2022 09:46       Latest Reference Range & Units 10/01/22 16:42  Class Description Allergens  Comment  D Pteronyssinus IgE Class 0 kU/L <0.10  D Farinae IgE Class 0 kU/L <0.10  Cat Dander IgE Class 0 kU/L <0.10  Dog Dander IgE Class 0 kU/L <0.10  Penicillium Chrysogen IgE Class 0 kU/L <0.10  Cladosporium Herbarum IgE Class 0 kU/L <0.10  Aspergillus Fumigatus IgE Class 0 kU/L <0.10   Mucor Racemosus IgE Class 0 kU/L <0.10  Alternaria Alternata IgE Class 0 kU/L <0.10  Stemphylium Herbarum IgE Class 0 kU/L <0.10  Goose Feathers IgE Class 0 kU/L <0.10  Chicken Feathers IgE Class 0 kU/L <0.10  Duck Feathers IgE Class 0 kU/L <0.10  IgE (Immunoglobulin E), Serum <OR=114 kU/L 3  Mouse Urine IgE Class 0 kU/L <0.10  Immunoglobulin A 70 - 320 mg/dL 119    OV 1/47/8295  Subjective:  Patient ID: Joseph Saunders, male , DOB: 09-18-1955 , age 44 y.o. , MRN: [id number] , ADDRESS: [address] [place] [address] PCP Joseph Quint, MD Patient Care Team: Joseph Quint, MD as PCP - General (Internal Medicine)  This Provider for this visit: Treatment Team:  Attending Provider: Kalman Shan, MD    09/01/2023 -   Chief Complaint  Patient presents with   Follow-up    Breathing is overall doing well. He has hernia repair surgery coming up.      HPI Joseph Saunders 68 y.o. -presents for follow-up.  He had bronchiectasis and pneumonia in the spring of last year.  He finally had a CT scan of the chest 2 days ago.  Personally visualized it and he has middle lobe and lingula bronchiectasis.  He has some nodular density on the right lung.  Official report is pending.  But overall CT scan is much improved compared to spring 2024.  He is feeling well.  He just has mild symptoms.  He has level 2 out of 5 dyspnea climbing stairs and the level 1 out of 5 cough.  He has upcoming inguinal hernia surgery.  He is also reporting abdominal tension.  1 day he had vomit after eating spaghetti and red tomato sauce and drinking a lot of grape juice.  Dr. Chevis Pretty is going to do the hernia surgery.  I did indicate to him to update Dr. Carolynne Edouard about the abdominal pain and vomiting it might be related to the hernia.  He thinks the hernia is reducible.  Overall his weight is stable.  He denies any Raynaud's.  Of note around Christmas 2024 he had flulike and URI  symptoms lasting 10 days.  He saw a nurse practitioner given Z-Pak and he got better.  He did show the chest x-ray to me today and it looked clear.    PFT     Latest Ref Rng & Units 11/02/2022    3:49 PM  PFT Results  FVC-Pre L 3.73   FVC-Predicted Pre % 74   FVC-Post L 4.31   FVC-Predicted Post % 85   Pre FEV1/FVC % % 48   Post FEV1/FCV % % 79   FEV1-Pre L 1.78   FEV1-Predicted Pre % 47   FEV1-Post L 3.41   DLCO uncorrected ml/min/mmHg 21.48   DLCO UNC% % 74   DLCO corrected ml/min/mmHg 21.48   DLCO COR %Predicted % 74   DLVA Predicted % 93  TLC L 6.14   TLC % Predicted % 80   RV % Predicted % 79        LAB RESULTS last 96 hours No results found.       has a past medical history of Anxiety and depression, Benign positional vertigo, Cataract, History of chicken pox, and Mitral valve prolapse.   reports that he has never smoked. He has been exposed to tobacco smoke. He has never used smokeless tobacco.  Past Surgical History:  Procedure Laterality Date   TONSILECTOMY, ADENOIDECTOMY, BILATERAL MYRINGOTOMY AND TUBES      Allergies  Allergen Reactions   Sulfa Antibiotics Nausea And Vomiting    Immunization History  Administered Date(s) Administered   Influenza, Quadrivalent, Recombinant, Inj, Pf 04/20/2023   Influenza,inj,Quad PF,6+ Mos 05/24/2013, 07/29/2015   Influenza-Unspecified 08/13/2016, 06/17/2017   PFIZER(Purple Top)SARS-COV-2 Vaccination 08/28/2019, 09/22/2019   Pneumococcal Conjugate-13 01/24/2019   Tdap 05/24/2013, 01/31/2022    Family History  Problem Relation Age of Onset   Heart disease Father 86       CHF; smoking/tobacco   Diverticulitis Brother    Mental illness Brother    Heart disease Paternal Grandfather    Abnormal EKG Paternal Grandfather    Osteoporosis Mother    Mental illness Brother    Cancer Maternal Grandmother    Hypertension Paternal Grandmother    Lung cancer Maternal Grandfather      Current Outpatient  Medications:    Ascorbic Acid (VITAMIN C) 1000 MG tablet, Take 1,000 mg by mouth daily., Disp: , Rfl:    aspirin 81 MG tablet, Take 81 mg by mouth daily., Disp: , Rfl:    fish oil-omega-3 fatty acids 1000 MG capsule, Take 2 g by mouth daily., Disp: , Rfl:    FLUZONE QUADRIVALENT 0.5 ML injection, ADM 0.5ML IM UTD, Disp: , Rfl: 0   lidocaine (LIDODERM) 5 %, Place 1 patch onto the skin daily. Remove & Discard patch within 12 hours or as directed by MD, Disp: 15 patch, Rfl: 0   Magnesium 400 MG TABS, Take by mouth daily., Disp: , Rfl:    Magnesium Carbonate (MAGNESIUM GLUCONATE) 54mg /35ml syringe, Take by mouth., Disp: , Rfl:    Multiple Vitamin (MULTI-VITAMINS) TABS, Take by mouth., Disp: , Rfl:    St Johns Wort 1000 MG CAPS, Take by mouth., Disp: , Rfl:    vitamin E (VITAMIN E) 400 UNIT capsule, Take 400 Units by mouth daily., Disp: , Rfl:    zoster vaccine live, PF, (ZOSTAVAX) [address] UNT/0.65ML injection, Inject 19,400 Units into the skin once., Disp: 1 each, Rfl: 0      Objective:   Vitals:   09/01/23 1510 09/01/23 1514  BP: 112/60   Pulse: 70   SpO2: 100%   Weight:  159 lb 12.8 oz (72.5 kg)  Height:  6\' 1"  (1.854 m)    Estimated body mass index is 21.08 kg/m as calculated from the following:   Height as of this encounter: 6\' 1"  (1.854 m).   Weight as of this encounter: 159 lb 12.8 oz (72.5 kg).  @WEIGHTCHANGE @  [employer]   09/01/23 1514  Weight: 159 lb 12.8 oz (72.5 kg)     Physical Exam   General: No distress. Looks wel O2 at rest: no Cane present: no Sitting in wheel chair: no Frail: no Obese: no Neuro: Alert and Oriented x 3. GCS 15. Speech normal Psych: Pleasant Resp:  Barrel Chest - no.  Wheeze - no, Crackles - no, No overt respiratory  distress CVS: Normal heart sounds. Murmurs - no Ext: Stigmata of Connective Tissue Disease - no HEENT: Normal upper airway. PEERL +. No post nasal drip        Assessment:       ICD-10-CM   1. Bronchiectasis without  complication (HCC)  J47.9 Antinuclear Antib (ANA)    IgG    IgM    Pulmonary function test         Plan:     Patient Instructions     ICD-10-CM   1. Bronchiectasis without complication (HCC)  J47.9      -Formal report pending but in my visualization CT scan of the chest 08/30/2023 is much improved compared to spring 2024.  Your bilateral middle lobe and left lingula bronchiectasis.  Because of this is not known.  Current symptom burden is very mild.  Plan --Wear a mask when you are out with clusters of people who might be sick or when you are doing yard work -Continue monitor your symptoms; report it was if you are getting worse -Get flutter valve device from [employer] AND/OR look at [facility] for daily pulmonary hygiene -Do blood work for IgG, IgM, ANA, blood work for bronchiectasis. -Do spirometry and DLCO in 6 months -Consider bronchoscopy if symptoms or lung function get worse or having repeated flareups  Followup  - 6 months but after spirometry and DLCO 15-minute visit t; 15-minute visit   FOLLOWUP Return in about 6 months (around 02/29/2024) for 15 min visit, with Dr Marchelle Gearing.    SIGNATURE    Dr. Kalman Saunders, M.D., F.C.C.P,  Pulmonary and Critical Care Medicine Staff Physician, [facility] Director - Interstitial Lung Disease  Program  Pulmonary Fibrosis [facility] [place], [place], [address]  Pager: [phone], If no answer or between  15:00h - 7:00h: call [phone] Telephone: [phone]  3:47 PM 09/01/2023

## 2023-09-02 ENCOUNTER — Encounter: Payer: Self-pay | Admitting: Internal Medicine

## 2023-09-02 LAB — IGM: IgM, Serum: 59 mg/dL (ref 50–300)

## 2023-09-02 LAB — ANA: Anti Nuclear Antibody (ANA): NEGATIVE

## 2023-09-02 LAB — IGG: IgG (Immunoglobin G), Serum: 747 mg/dL (ref 600–1540)
